# Patient Record
Sex: Female | Born: 1975 | Race: Black or African American | Hispanic: No | Marital: Single | Smoking: Never smoker
Health system: Southern US, Community
[De-identification: ages and names within clinical notes are randomized; demographics above are authoritative.]

## PROBLEM LIST (undated history)

## (undated) DIAGNOSIS — G479 Sleep disorder, unspecified: Secondary | ICD-10-CM

## (undated) DIAGNOSIS — O1490 Unspecified pre-eclampsia, unspecified trimester: Secondary | ICD-10-CM

## (undated) DIAGNOSIS — K219 Gastro-esophageal reflux disease without esophagitis: Secondary | ICD-10-CM

## (undated) DIAGNOSIS — Z8719 Personal history of other diseases of the digestive system: Secondary | ICD-10-CM

## (undated) HISTORY — PX: CHOLECYSTECTOMY: SHX55

## (undated) HISTORY — PX: OTHER SURGICAL HISTORY: SHX169

---

## 2004-01-08 ENCOUNTER — Inpatient Hospital Stay: Payer: Self-pay | Admitting: Obstetrics and Gynecology

## 2005-02-26 IMAGING — CR DG CHEST 2V
1 series · 2 of 2 positions shown · non-contrast
Comparison: none

REASON FOR EXAM: rm sp 2     Flu symptoms
COMMENTS:

PROCEDURE:     DXR - DXR CHEST PA (OR AP) AND LATERAL  - [DATE]  [DATE]
RESULT:        The lungs are clear.  Cardiovascular structures are
unremarkable.

[Series 1: view not recorded · 0.17mm/px · 2 of 2 slices shown]
[im 1/2]
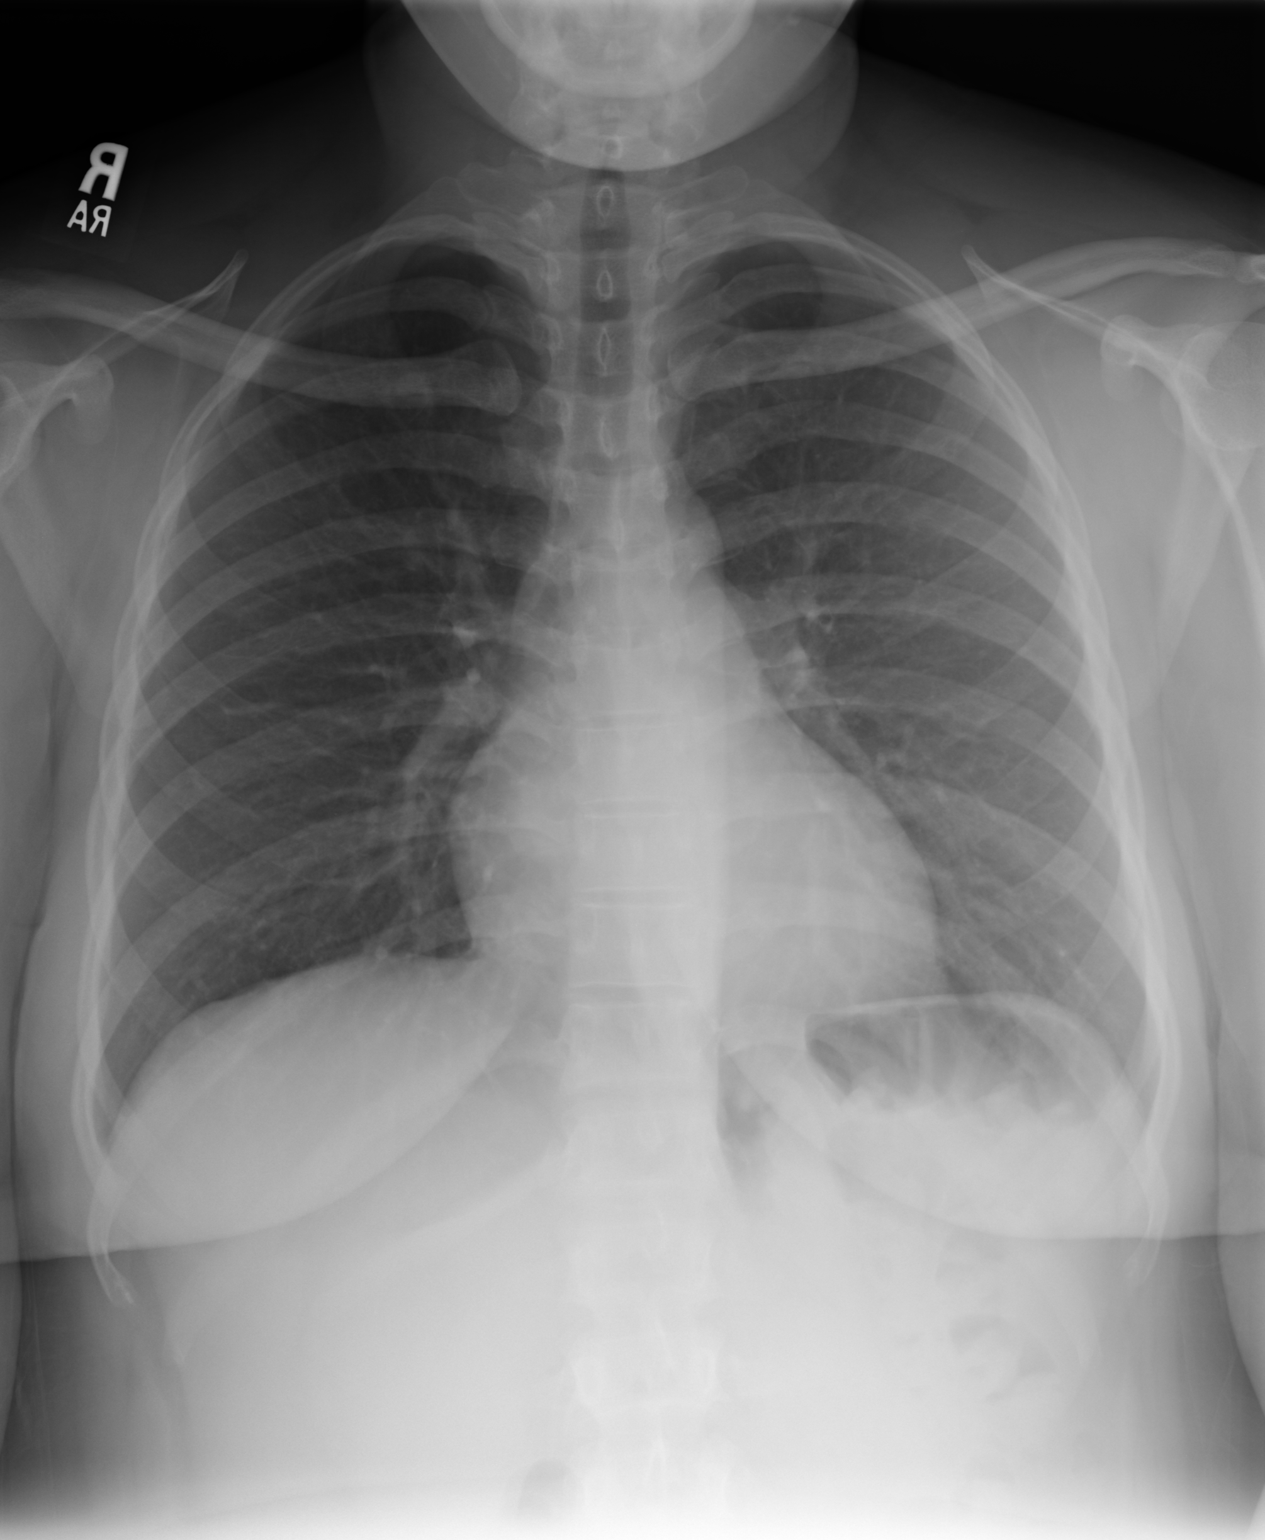
[im 2/2]
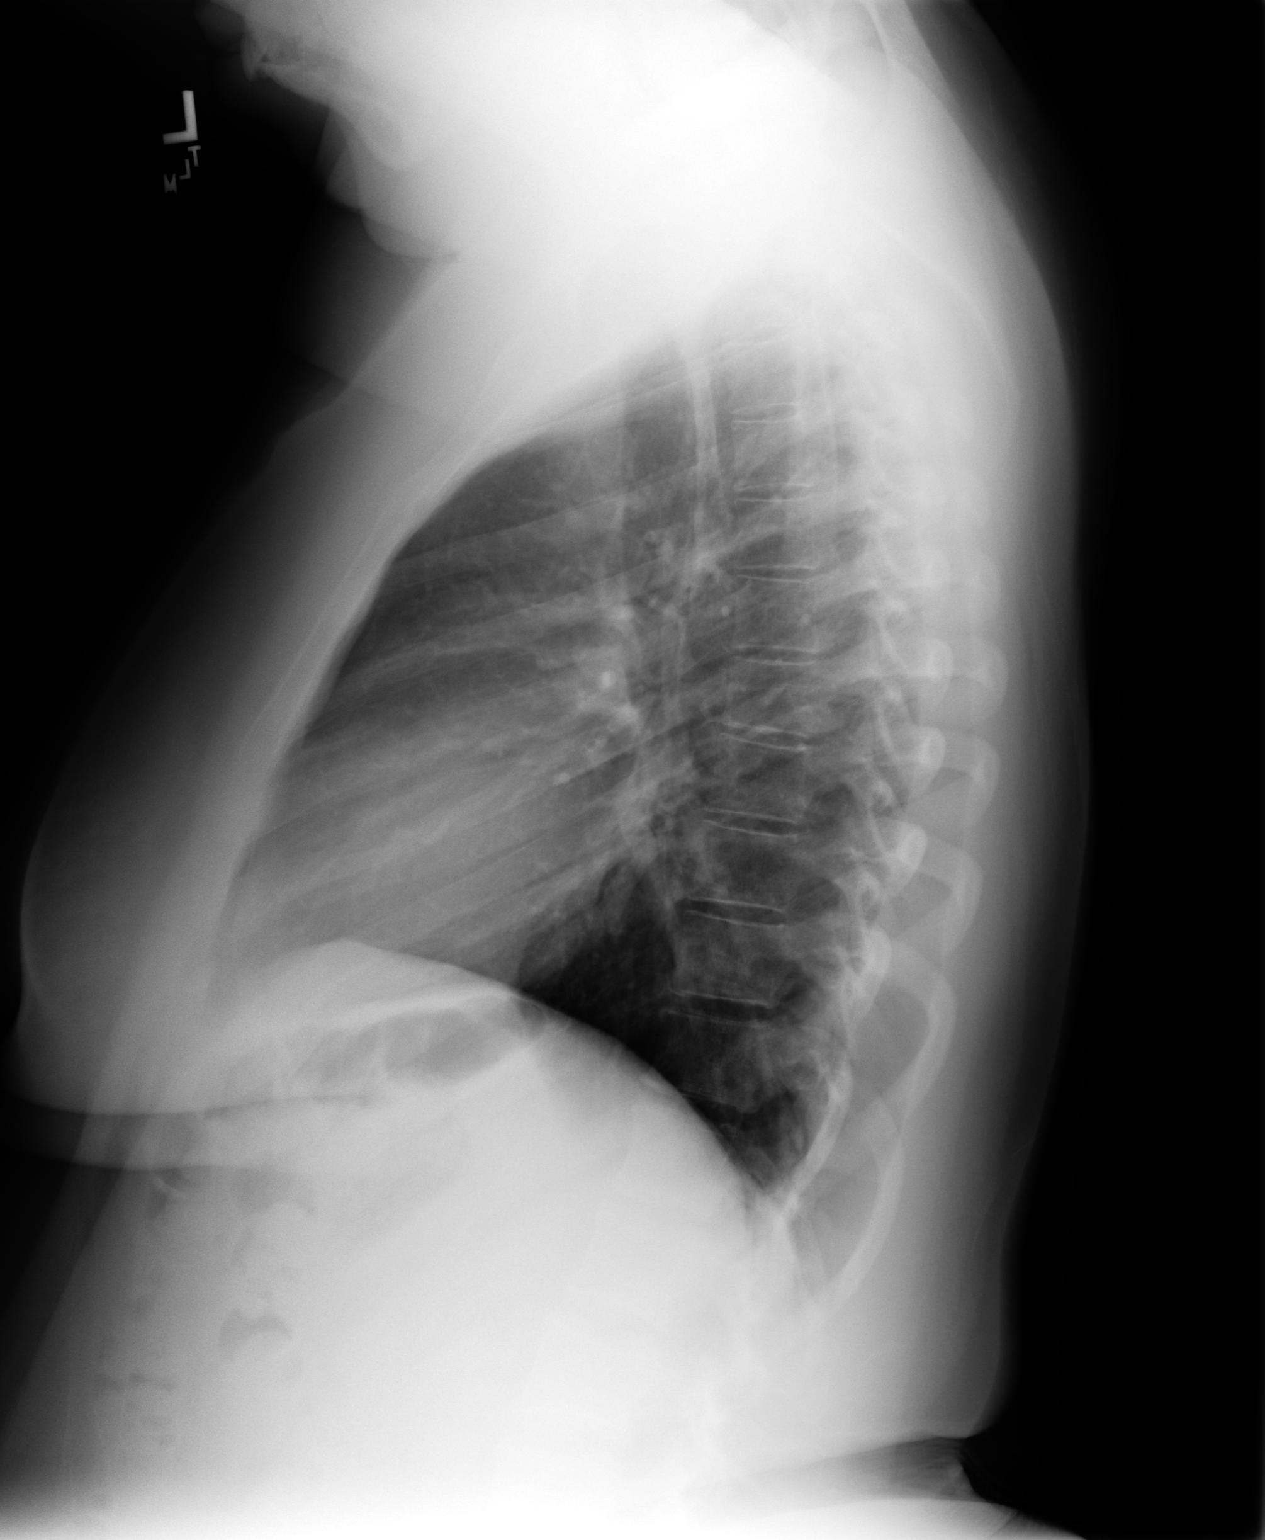

[2 of 2 positions shown; findings below may reference images not displayed]

IMPRESSION: No acute cardiopulmonary disease.

## 2005-05-18 ENCOUNTER — Emergency Department: Payer: Self-pay | Admitting: Emergency Medicine

## 2010-10-18 IMAGING — US ABDOMEN ULTRASOUND
1 series · 17 of 25 positions shown · non-contrast
Comparison: none

REASON FOR EXAM: epigastric pain, Elevated LFTs
COMMENTS:

[Series 1: abdomen ultrasound · 17 of 57 slices shown]
[im 1/57]
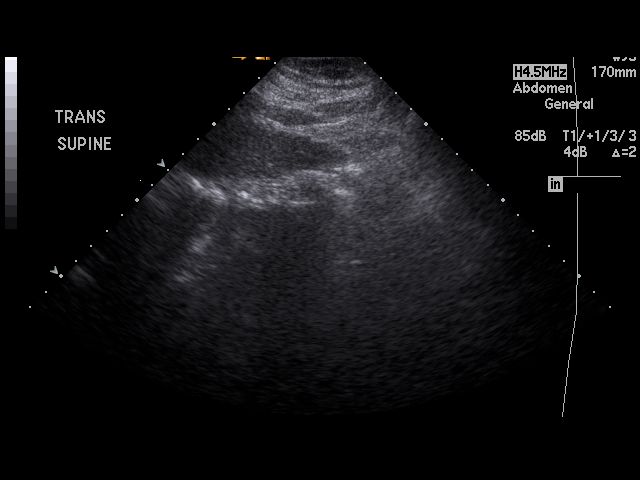
[im 5/57]
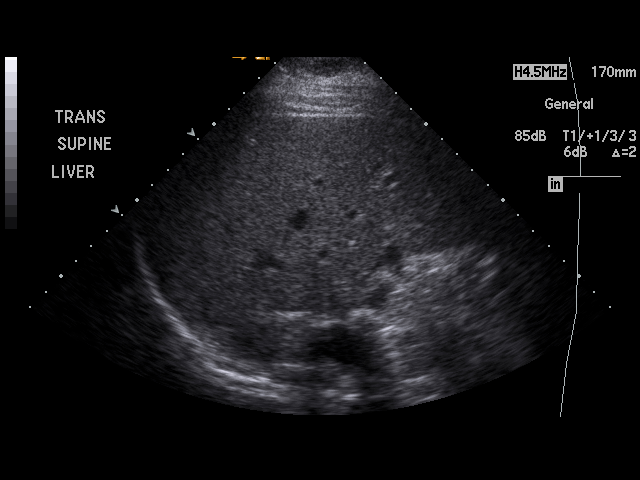
[im 8/57]
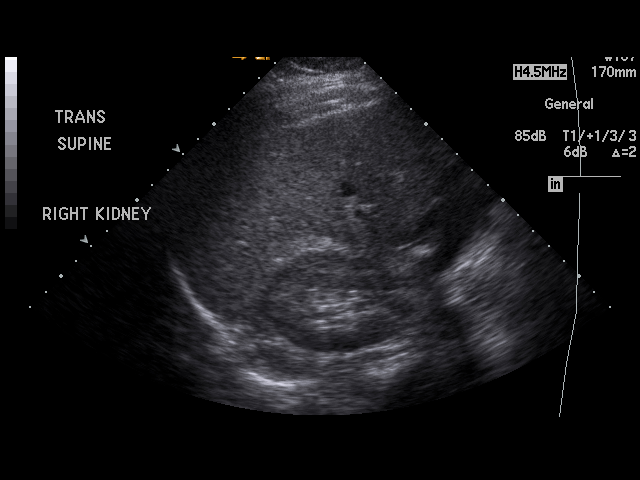
[im 12/57]
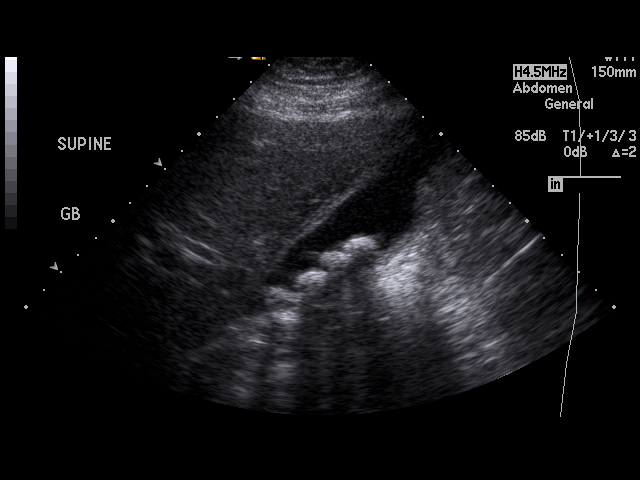
[im 15/57]
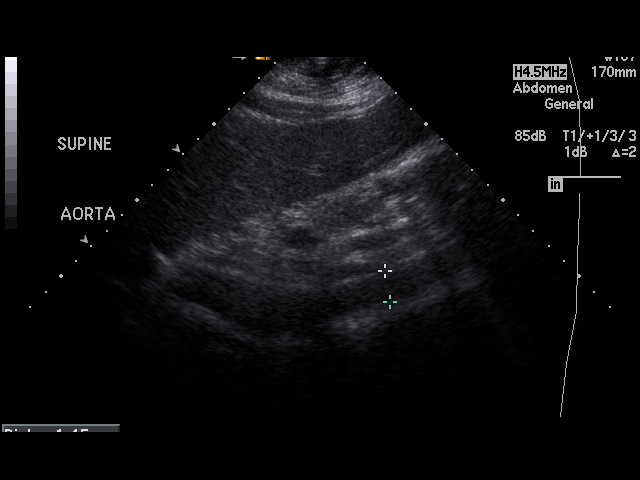
[im 19/57]
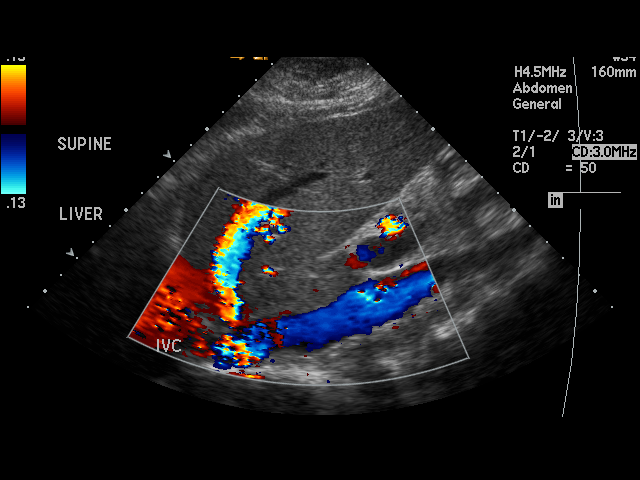
[im 22/57]
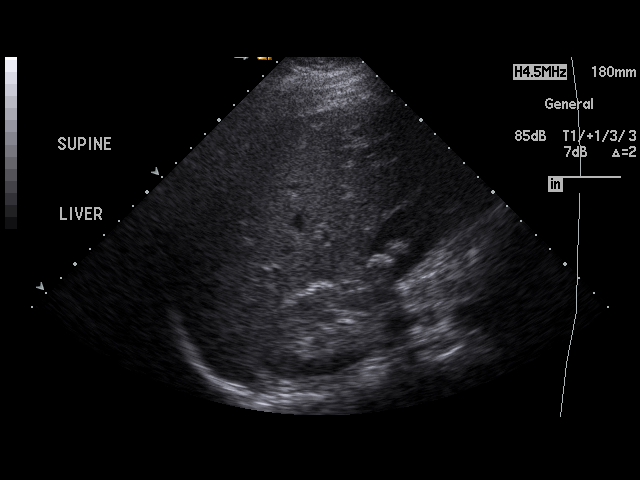
[im 26/57]
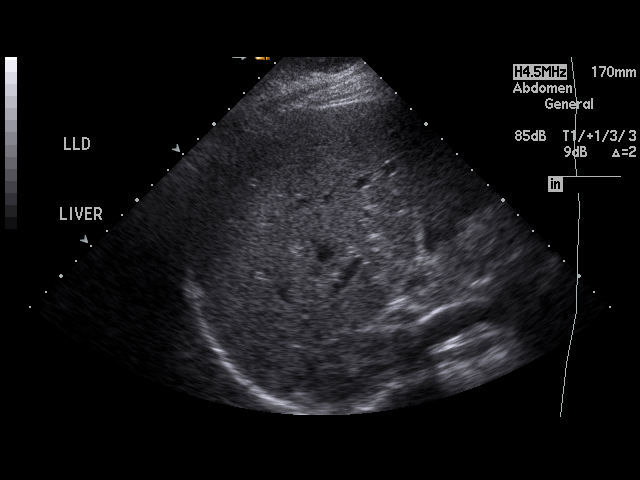
[im 29/57]
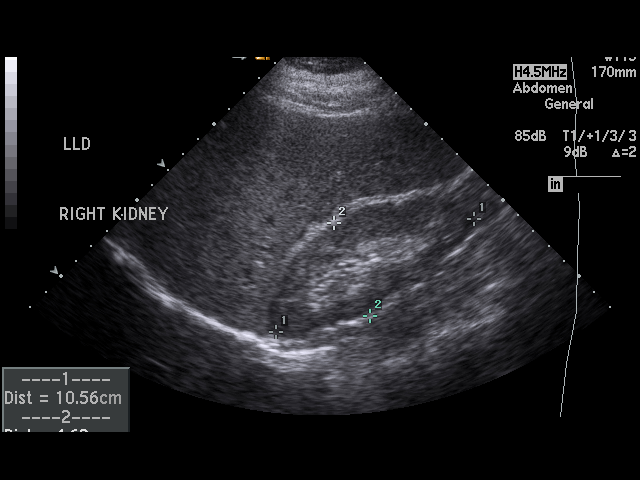
[im 31/57]
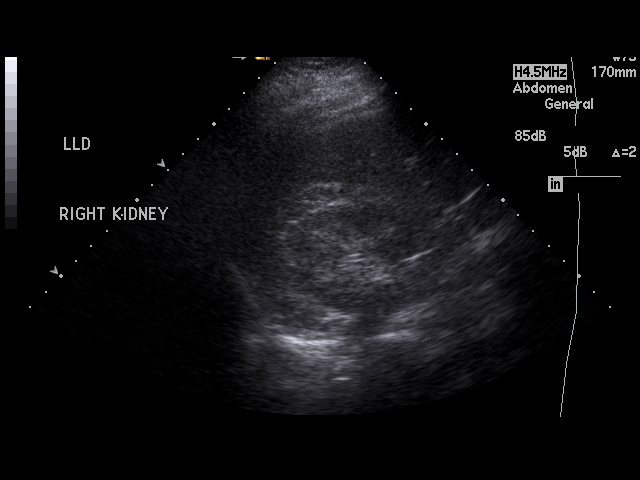
[im 36/57]
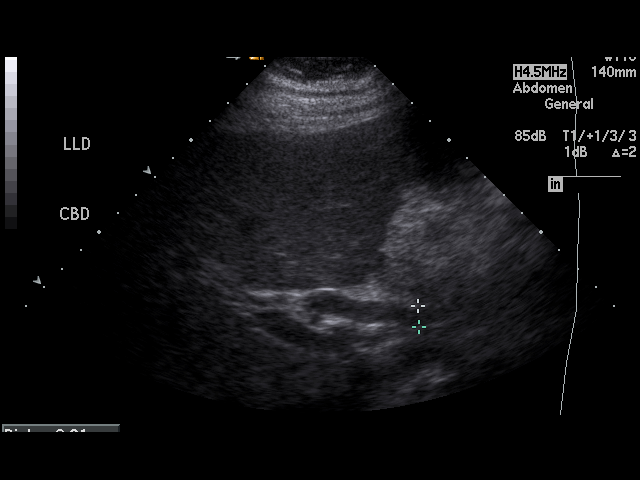
[im 38/57]
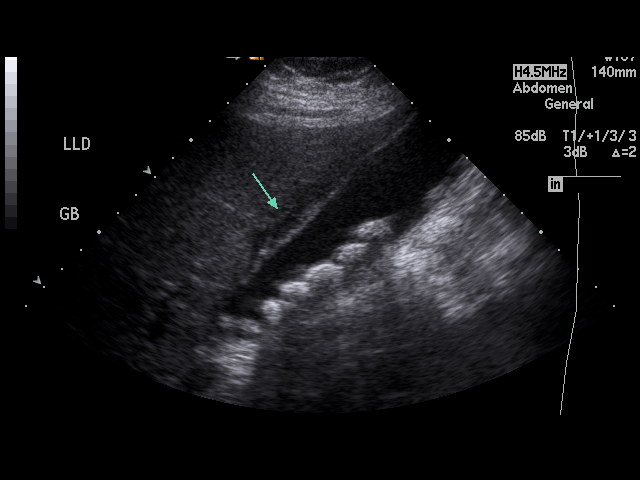
[im 43/57]
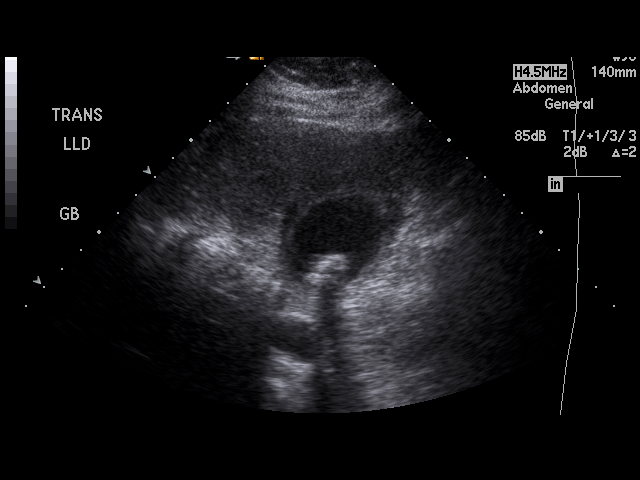
[im 45/57]
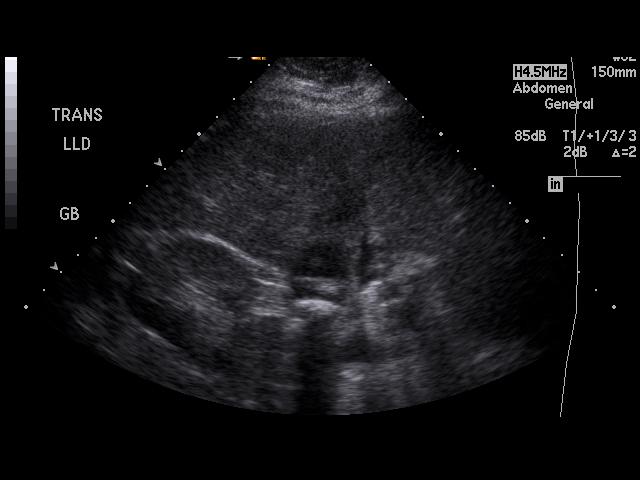
[im 50/57]
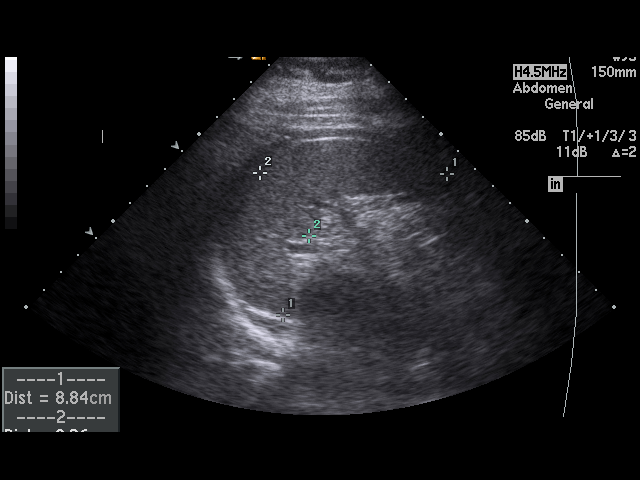
[im 52/57]
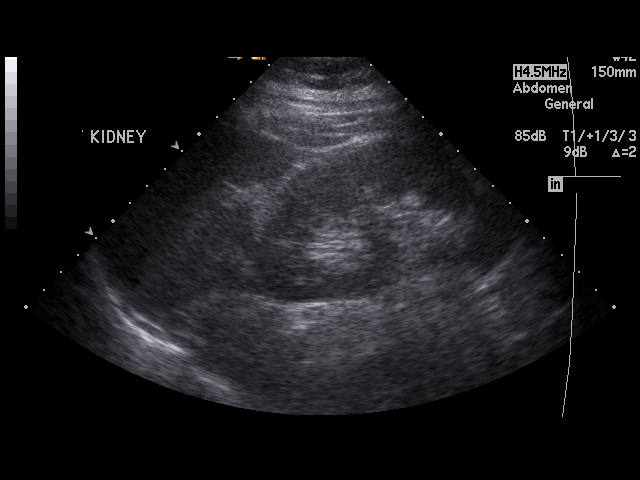
[im 57/57]
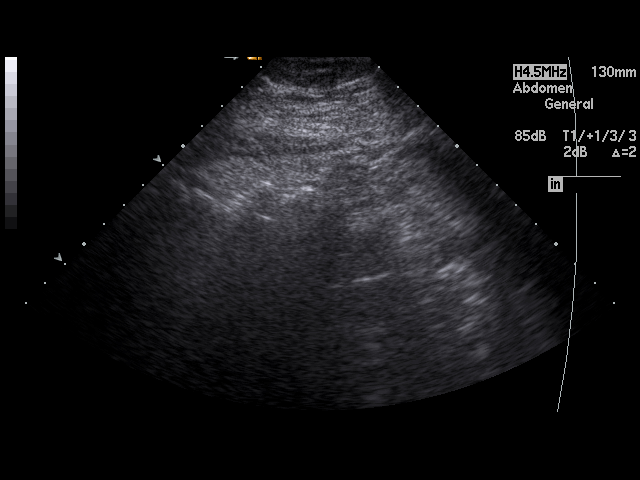

[17 of 25 positions shown; findings below may reference images not displayed]

PROCEDURE:     US  - US ABDOMEN GENERAL SURVEY  - [DATE] [DATE]

RESULT:     The liver exhibits normal echotexture with no focal mass nor
ductal dilation. Portal venous flow is normal in direction toward the liver.
The pancreas could not be demonstrated due to the presence of bowel gas. The
gallbladder is adequately distended and contains multiple echogenic mobile
shadowing stones. There is a positive sonographic Murphy's sign. There is
mild gallbladder wall thickening with a measurement of 4 mm. There is a
small amount of pericholecystic fluid. The common bile duct is mildly
dilated at 8.1 mm.

The spleen is normal in echotexture and measures 8.8 centimeters in length.
The abdominal aorta, inferior vena cava, and kidneys exhibit no acute
abnormality. There is no evidence of ascites.
IMPRESSION: 1. There are multiple gallstones present. There are also findings consistent
with acute cholecystitis including a positive sonographic Murphy's sign,
gallbladder wall thickening, and pericholecystic fluid.
2. There is mild dilation of the common bile duct to 8.1 mm.
3. The pancreas could not be visualized due to the presence of bowel gas.

## 2010-10-19 ENCOUNTER — Inpatient Hospital Stay: Payer: Self-pay | Admitting: Surgery

## 2010-10-19 IMAGING — CR DG CHOLANGIOGRAM OPERATIVE
2 series · 2 of 2 positions shown · non-contrast
Comparison: none

REASON FOR EXAM: LAP CHOLE
COMMENTS:

[1]
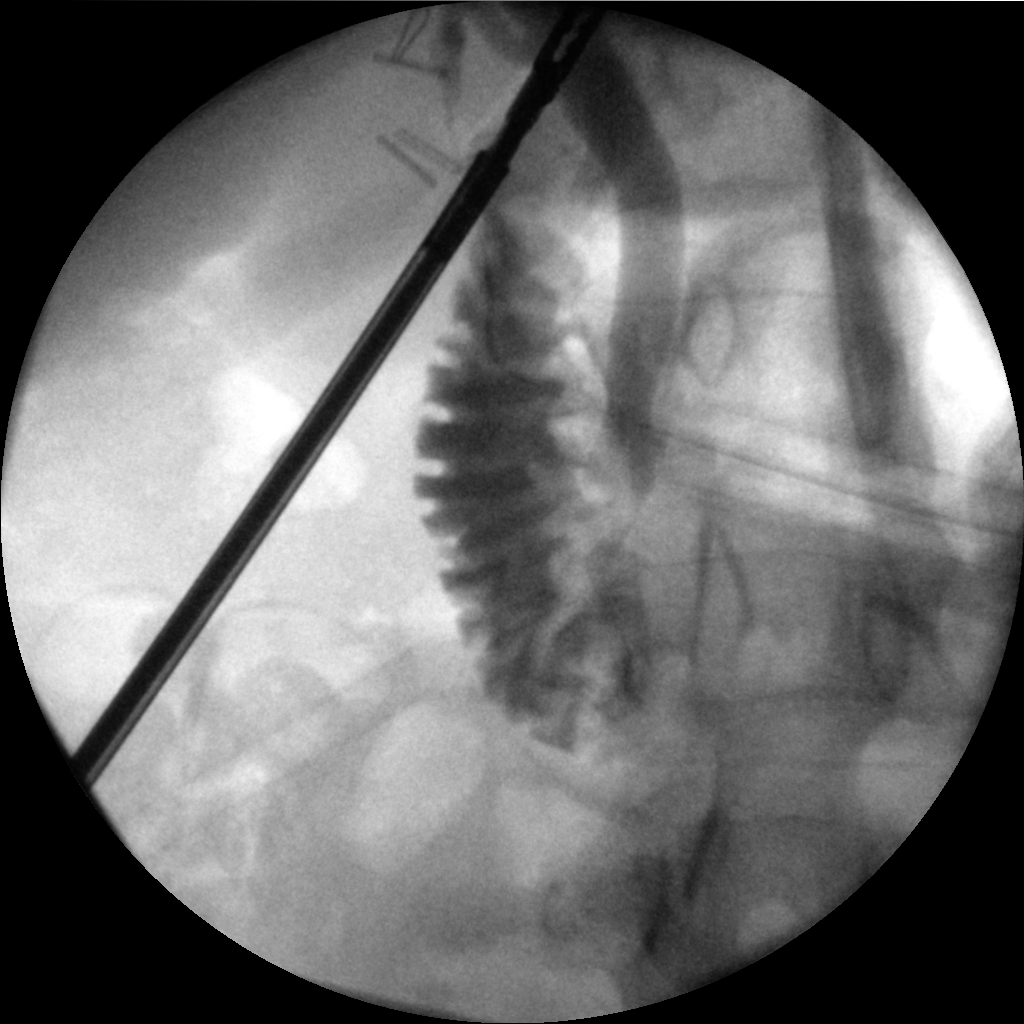

[4]
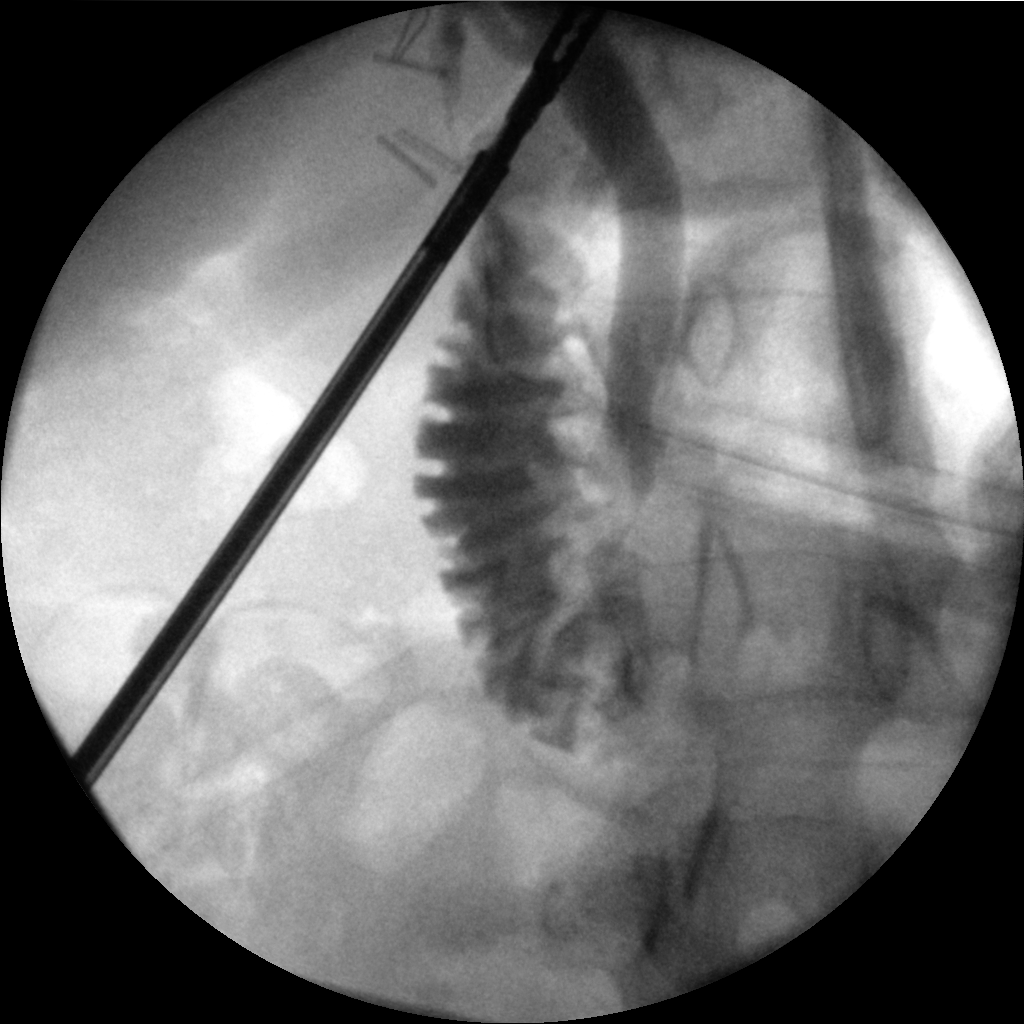

[2 of 2 positions shown; findings below may reference images not displayed]

PROCEDURE:     DXR - DXR CHOLANGIOGRAM OP (INITIAL)  - [DATE]  [DATE]

RESULT:     Contrast material is visualized in the common duct and a portion
of the common hepatic duct. Contrast is noted to flow into the duodenum
without evidence of obstruction. There is tapering of the distal common duct
consistent with spasm.
IMPRESSION: 1.     No retained stone or other acute change is identified.

## 2014-12-26 ENCOUNTER — Other Ambulatory Visit: Payer: Self-pay | Admitting: Nurse Practitioner

## 2014-12-26 DIAGNOSIS — R1032 Left lower quadrant pain: Secondary | ICD-10-CM

## 2014-12-26 DIAGNOSIS — K625 Hemorrhage of anus and rectum: Secondary | ICD-10-CM

## 2014-12-26 DIAGNOSIS — R1031 Right lower quadrant pain: Secondary | ICD-10-CM

## 2014-12-26 DIAGNOSIS — R634 Abnormal weight loss: Secondary | ICD-10-CM

## 2015-01-01 ENCOUNTER — Ambulatory Visit
Admission: RE | Admit: 2015-01-01 | Discharge: 2015-01-01 | Disposition: A | Discharge status: Home / Self Care | Payer: Medicaid Other | Source: Ambulatory Visit | Attending: Nurse Practitioner | Admitting: Nurse Practitioner

## 2015-01-01 ENCOUNTER — Ambulatory Visit: Admission: RE | Admit: 2015-01-01 | Payer: Medicaid Other | Source: Ambulatory Visit | Admitting: Gastroenterology

## 2015-01-01 ENCOUNTER — Encounter: Admission: RE | Payer: Self-pay | Source: Ambulatory Visit

## 2015-01-01 DIAGNOSIS — K625 Hemorrhage of anus and rectum: Secondary | ICD-10-CM | POA: Diagnosis not present

## 2015-01-01 DIAGNOSIS — R1030 Lower abdominal pain, unspecified: Secondary | ICD-10-CM | POA: Diagnosis present

## 2015-01-01 DIAGNOSIS — K449 Diaphragmatic hernia without obstruction or gangrene: Secondary | ICD-10-CM | POA: Diagnosis not present

## 2015-01-01 DIAGNOSIS — R634 Abnormal weight loss: Secondary | ICD-10-CM | POA: Insufficient documentation

## 2015-01-01 DIAGNOSIS — R1031 Right lower quadrant pain: Secondary | ICD-10-CM

## 2015-01-01 DIAGNOSIS — R1032 Left lower quadrant pain: Secondary | ICD-10-CM

## 2015-01-01 IMAGING — CT CT ABD-PELV W/ CM
2 of 4 series · 17 of 46 positions shown, 19 images · IV contrast (omnipaque)
Comparison: Abdomen ultrasound dated [DATE].

CLINICAL DATA: Rectal bleeding and lower abdominal pain for the
past 10 years. Worsening. Previous cholecystectomy.

EXAM:
CT ABDOMEN AND PELVIS WITH CONTRAST
TECHNIQUE: Multidetector CT imaging of the abdomen and pelvis was performed
using the standard protocol following bolus administration of
intravenous contrast.
CONTRAST:  100mL OMNIPAQUE IOHEXOL 300 MG/ML  SOLN

[Series 2: routine abd pel with · axial · 0.96mm/px · z∈[-498,-58]mm · 14 of 96 slices shown, 16 images]
[im 4/96  soft-tissue]
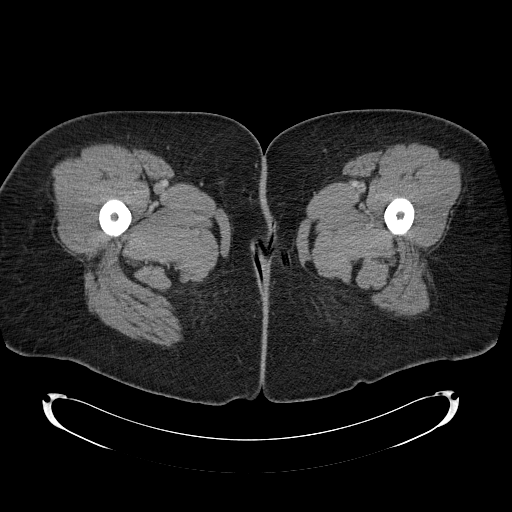
[im 4/96  bone]
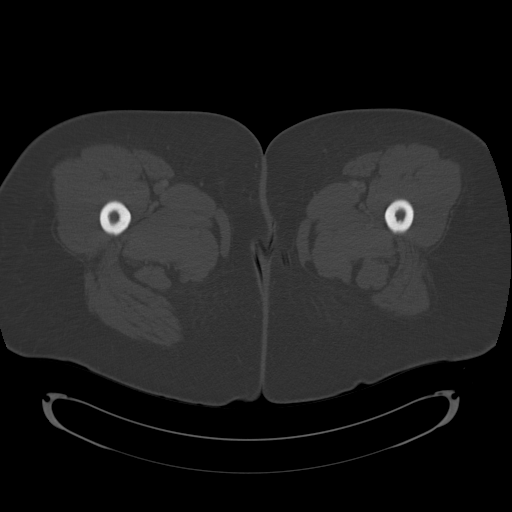
[im 12/96  soft-tissue]
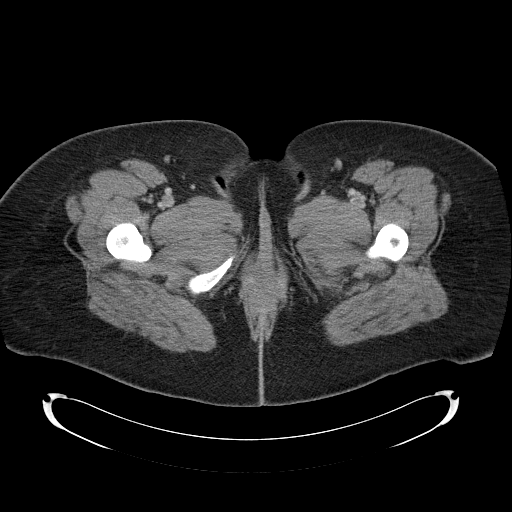
[im 20/96  soft-tissue]
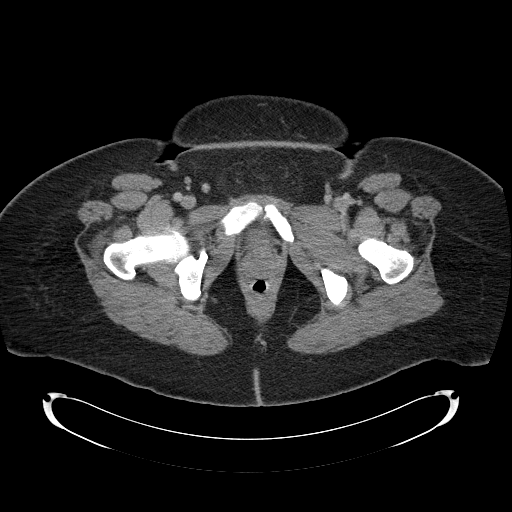
[im 27/96  soft-tissue]
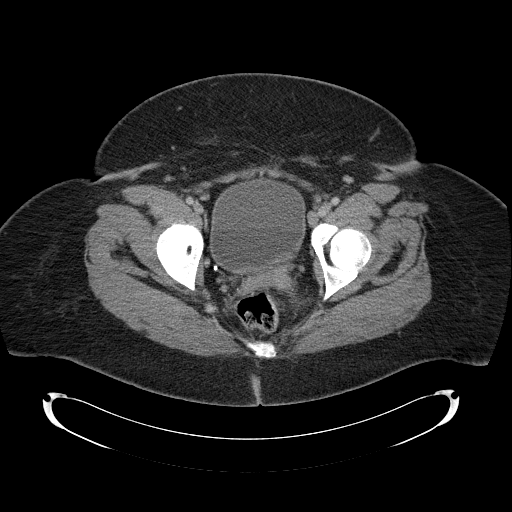
[im 31/96  soft-tissue]
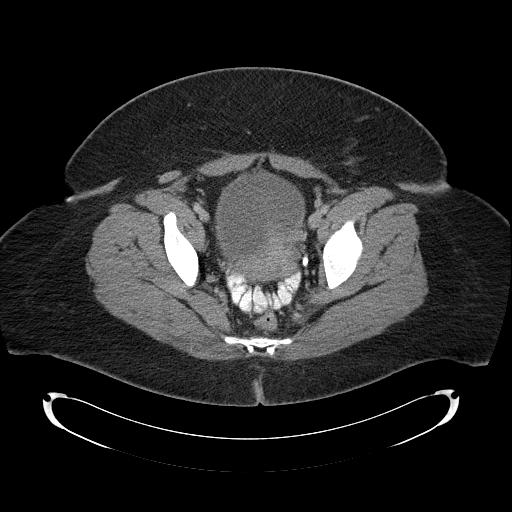
[im 39/96  soft-tissue]
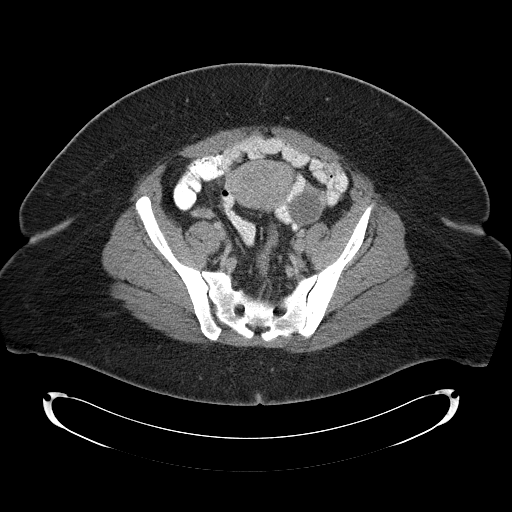
[im 46/96  soft-tissue]
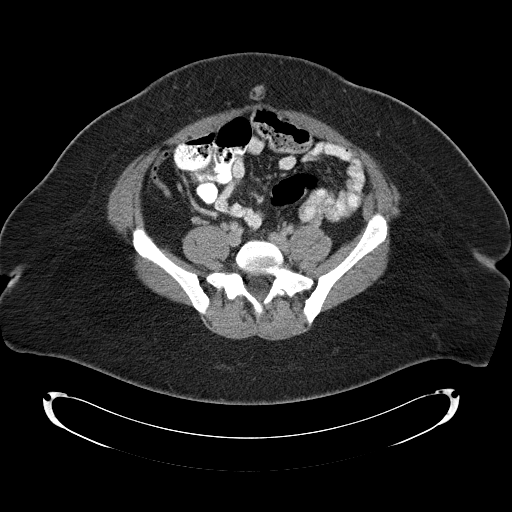
[im 50/96  soft-tissue]
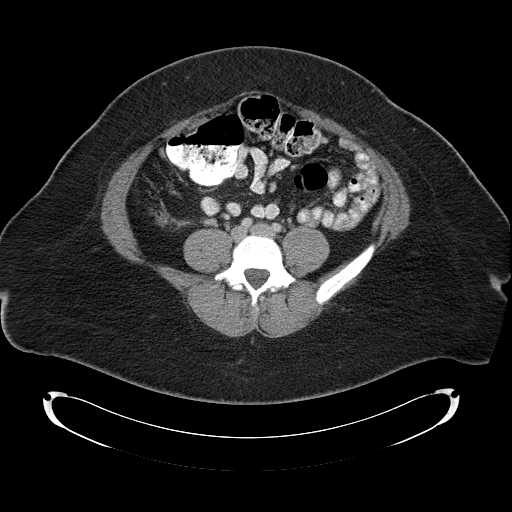
[im 58/96  soft-tissue]
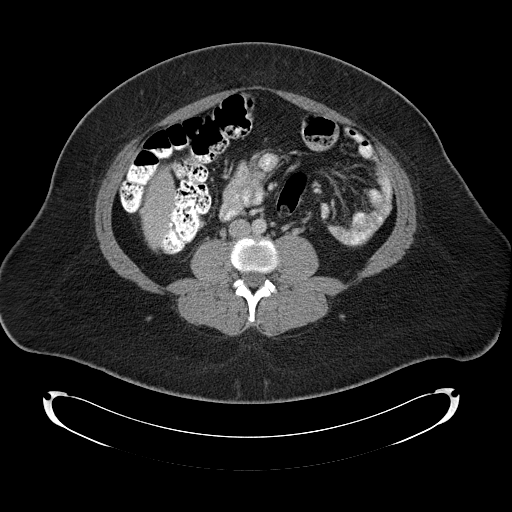
[im 58/96  bone]
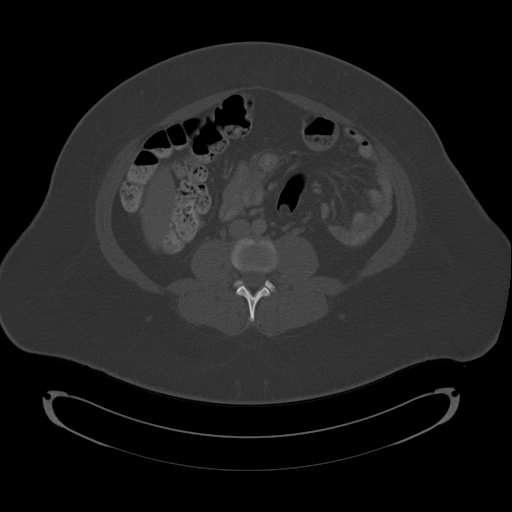
[im 65/96  soft-tissue]
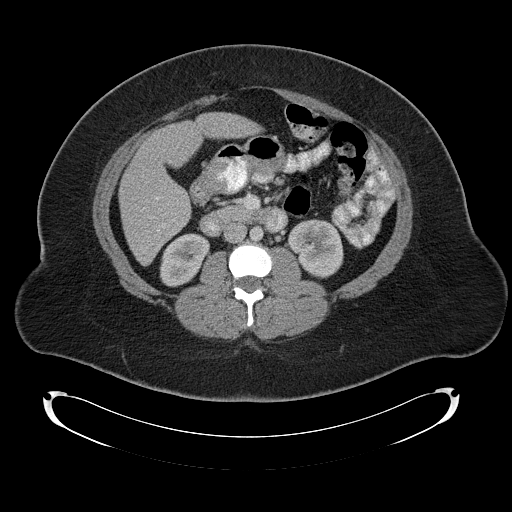
[im 73/96  soft-tissue]
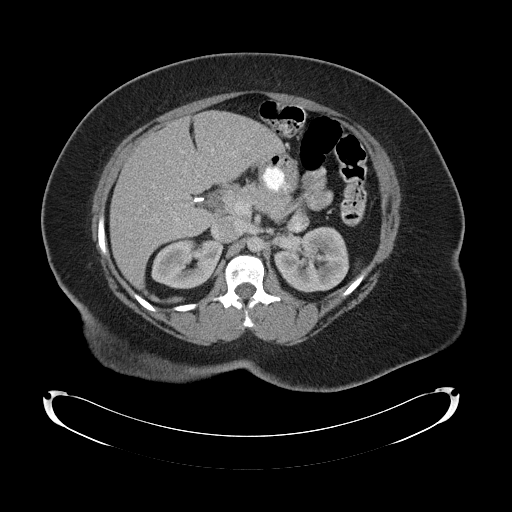
[im 77/96  soft-tissue]
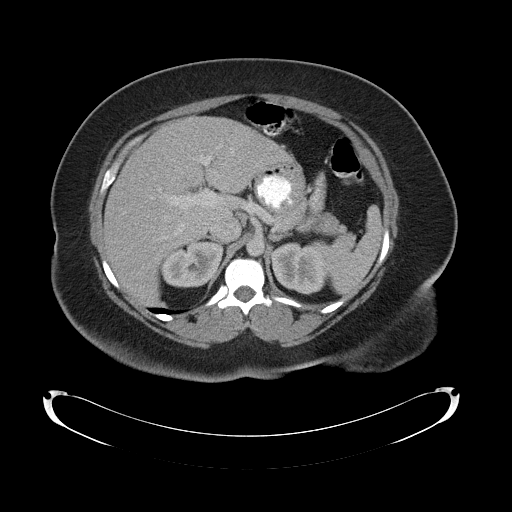
[im 84/96  soft-tissue]
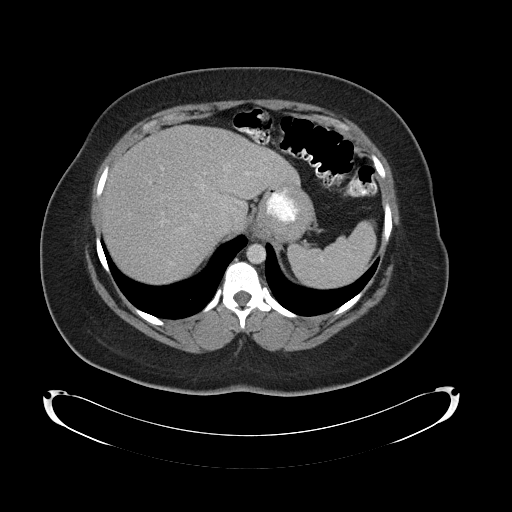
[im 92/96  soft-tissue]
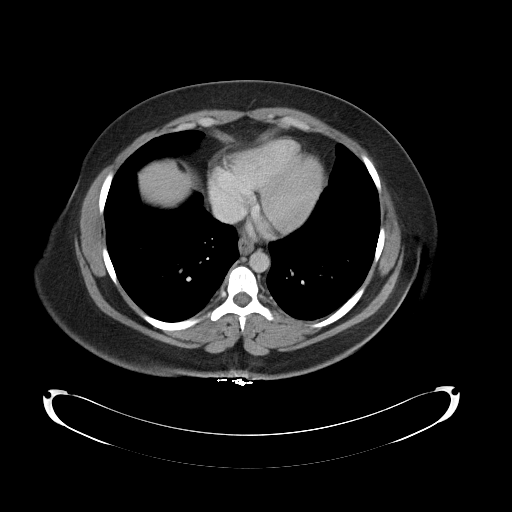

[Series 5: cor routine abd pel with · coronal · 0.87mm/px · 3 of 140 slices shown]
[im 47/140  soft-tissue]
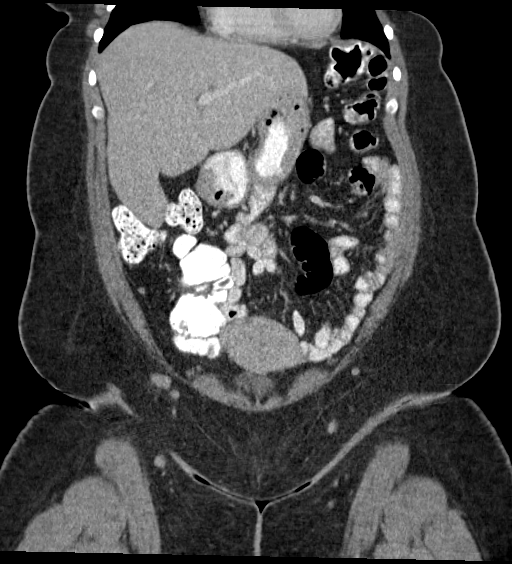
[im 62/140  soft-tissue]
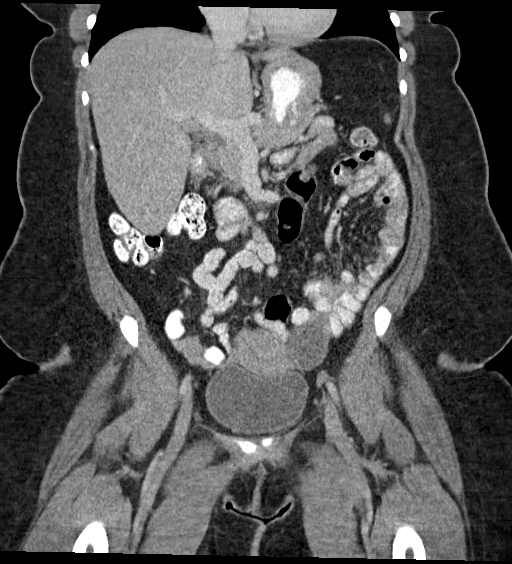
[im 78/140  soft-tissue]
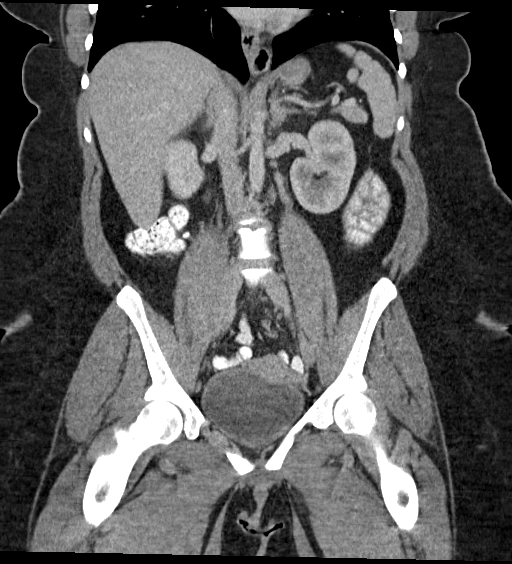

[17 of 46 positions shown; findings below may reference images not displayed]

FINDINGS: Cholecystectomy clips. 3.0 cm left ovarian follicle. Normal
appearing liver, spleen, pancreas, adrenal glands, kidneys, ureters,
urinary bladder, uterus and right ovary. Small sliding hiatal
hernia. No intestinal abnormalities or enlarged lymph nodes. Normal
appearing appendix. Clear lung bases. Mild lower thoracic spine
degenerative changes.
IMPRESSION: Small sliding hiatal hernia.  Otherwise, unremarkable examination.

## 2015-01-01 SURGERY — COLONOSCOPY WITH PROPOFOL
Anesthesia: General

## 2015-01-01 MED ORDER — IOHEXOL 300 MG/ML  SOLN
100.0000 mL | Freq: Once | INTRAMUSCULAR | Status: AC | PRN
  Administered 2015-01-01: 100 mL via INTRAVENOUS

## 2015-02-18 ENCOUNTER — Ambulatory Visit: Payer: Medicaid Other | Attending: Specialist

## 2015-02-18 DIAGNOSIS — G4733 Obstructive sleep apnea (adult) (pediatric): Secondary | ICD-10-CM | POA: Insufficient documentation

## 2015-02-18 DIAGNOSIS — R0683 Snoring: Secondary | ICD-10-CM | POA: Diagnosis present

## 2015-02-18 DIAGNOSIS — E669 Obesity, unspecified: Secondary | ICD-10-CM | POA: Insufficient documentation

## 2015-03-12 ENCOUNTER — Encounter: Payer: Self-pay | Admitting: *Deleted

## 2015-03-13 ENCOUNTER — Ambulatory Visit: Payer: Medicaid Other | Admitting: Registered Nurse

## 2015-03-13 ENCOUNTER — Encounter
Admission: RE | Disposition: A | Discharge status: Home / Self Care | Payer: Self-pay | Source: Ambulatory Visit | Attending: Gastroenterology

## 2015-03-13 ENCOUNTER — Ambulatory Visit
Admission: RE | Admit: 2015-03-13 | Discharge: 2015-03-13 | Disposition: A | Discharge status: Home / Self Care | Payer: Medicaid Other | Source: Ambulatory Visit | Attending: Gastroenterology | Admitting: Gastroenterology

## 2015-03-13 ENCOUNTER — Encounter: Payer: Self-pay | Admitting: *Deleted

## 2015-03-13 DIAGNOSIS — K2950 Unspecified chronic gastritis without bleeding: Secondary | ICD-10-CM | POA: Insufficient documentation

## 2015-03-13 DIAGNOSIS — B9681 Helicobacter pylori [H. pylori] as the cause of diseases classified elsewhere: Secondary | ICD-10-CM | POA: Insufficient documentation

## 2015-03-13 DIAGNOSIS — K625 Hemorrhage of anus and rectum: Secondary | ICD-10-CM | POA: Diagnosis not present

## 2015-03-13 DIAGNOSIS — R634 Abnormal weight loss: Secondary | ICD-10-CM | POA: Diagnosis not present

## 2015-03-13 DIAGNOSIS — K449 Diaphragmatic hernia without obstruction or gangrene: Secondary | ICD-10-CM | POA: Diagnosis not present

## 2015-03-13 DIAGNOSIS — Z6841 Body Mass Index (BMI) 40.0 and over, adult: Secondary | ICD-10-CM | POA: Diagnosis not present

## 2015-03-13 DIAGNOSIS — K219 Gastro-esophageal reflux disease without esophagitis: Secondary | ICD-10-CM | POA: Diagnosis not present

## 2015-03-13 DIAGNOSIS — I10 Essential (primary) hypertension: Secondary | ICD-10-CM | POA: Diagnosis not present

## 2015-03-13 HISTORY — DX: Gastro-esophageal reflux disease without esophagitis: K21.9

## 2015-03-13 HISTORY — PX: COLONOSCOPY WITH PROPOFOL: SHX5780

## 2015-03-13 HISTORY — DX: Sleep disorder, unspecified: G47.9

## 2015-03-13 HISTORY — DX: Unspecified pre-eclampsia, unspecified trimester: O14.90

## 2015-03-13 HISTORY — DX: Personal history of other diseases of the digestive system: Z87.19

## 2015-03-13 HISTORY — PX: ESOPHAGOGASTRODUODENOSCOPY (EGD) WITH PROPOFOL: SHX5813

## 2015-03-13 LAB — POCT PREGNANCY, URINE: Preg Test, Ur: NEGATIVE

## 2015-03-13 SURGERY — ESOPHAGOGASTRODUODENOSCOPY (EGD) WITH PROPOFOL
Anesthesia: General

## 2015-03-13 MED ORDER — PROPOFOL 500 MG/50ML IV EMUL
INTRAVENOUS | Status: DC | PRN
  Administered 2015-03-13: 170 ug/kg/min via INTRAVENOUS

## 2015-03-13 MED ORDER — PROPOFOL 10 MG/ML IV BOLUS
INTRAVENOUS | Status: DC | PRN
  Administered 2015-03-13: 50 mg via INTRAVENOUS

## 2015-03-13 MED ORDER — GLYCOPYRROLATE 0.2 MG/ML IJ SOLN
INTRAMUSCULAR | Status: DC | PRN
  Administered 2015-03-13: 0.2 mg via INTRAVENOUS

## 2015-03-13 MED ORDER — SODIUM CHLORIDE 0.9 % IV SOLN
INTRAVENOUS | Status: DC
  Administered 2015-03-13: 11:00:00 via INTRAVENOUS

## 2015-03-13 MED ORDER — SODIUM CHLORIDE 0.9 % IV SOLN
INTRAVENOUS | Status: DC
  Administered 2015-03-13: 1000 mL via INTRAVENOUS

## 2015-03-13 MED ORDER — FENTANYL CITRATE (PF) 100 MCG/2ML IJ SOLN
INTRAMUSCULAR | Status: DC | PRN
  Administered 2015-03-13: 50 ug via INTRAVENOUS

## 2015-03-13 MED ORDER — MIDAZOLAM HCL 2 MG/2ML IJ SOLN
INTRAMUSCULAR | Status: DC | PRN
  Administered 2015-03-13: 1 mg via INTRAVENOUS

## 2015-03-13 MED ORDER — LIDOCAINE HCL (CARDIAC) 20 MG/ML IV SOLN
INTRAVENOUS | Status: DC | PRN
  Administered 2015-03-13: 40 mg via INTRAVENOUS

## 2015-03-13 NOTE — Anesthesia Postprocedure Evaluation (Signed)
Anesthesia Post Note  Patient: Leah Dodson  Procedure(s) Performed: Procedure(s) (LRB): ESOPHAGOGASTRODUODENOSCOPY (EGD) WITH PROPOFOL (N/A) COLONOSCOPY WITH PROPOFOL (N/A)  Patient location during evaluation: PACU Anesthesia Type: General Level of consciousness: awake Pain management: satisfactory to patient Vital Signs Assessment: post-procedure vital signs reviewed and stable Respiratory status: spontaneous breathing Cardiovascular status: stable Anesthetic complications: no    Last Vitals:  Filed Vitals:   03/13/15 1105 03/13/15 1108  BP: 80/49 99/51  Pulse: 70 93  Temp: 36.2 C   Resp: 18 35    Last Pain: There were no vitals filed for this visit.               VAN STAVEREN,[name]

## 2015-03-13 NOTE — Op Note (Signed)
[facility] Gastroenterology Patient Name: Leah Dodson Procedure Date: 03/13/2015 10:39 AM MRN: [id number] Account #: [id number] Date of Birth: 1976-02-03 Admit Type: Outpatient Age: 40 Room: [facility] ENDO ROOM 4 Gender: Female Note Status: Finalized Procedure:         Colonoscopy Indications:       Rectal bleeding, Weight loss Providers:         Ezzard Standing. Bluford Kaufmann, MD Medicines:         Monitored Anesthesia Care Complications:     No immediate complications. Procedure:         Pre-Anesthesia Assessment:                    - Prior to the procedure, a History and Physical was                     performed, and patient medications, allergies and                     sensitivities were reviewed. The patient's tolerance of                     previous anesthesia was reviewed.                    - The risks and benefits of the procedure and the sedation                     options and risks were discussed with the patient. All                     questions were answered and informed consent was obtained.                    - After reviewing the risks and benefits, the patient was                     deemed in satisfactory condition to undergo the procedure.                    After obtaining informed consent, the colonoscope was                     passed under direct vision. Throughout the procedure, the                     patient's blood pressure, pulse, and oxygen saturations                     were monitored continuously. The Colonoscope was                     introduced through the anus and advanced to the the cecum,                     identified by appendiceal orifice and ileocecal valve. The                     colonoscopy was performed without difficulty. The patient                     tolerated the procedure well. The quality of the bowel                     preparation was good. Findings:  The colon (entire examined portion) appeared normal. Impression:         - The entire examined colon is normal.                    - No specimens collected. Recommendation:    - Discharge patient to home.                    - Repeat colonoscopy in 10 years for screening purposes.                    - The findings and recommendations were discussed with the                     patient. Procedure Code(s): --- Professional ---                    [phone], Colonoscopy, flexible; diagnostic, including                     collection of specimen(s) by brushing or washing, when                     performed (separate procedure) Diagnosis Code(s): --- Professional ---                    K62.5, Hemorrhage of anus and rectum                    R63.4, Abnormal weight loss CPT copyright 2014 American Medical Association. All rights reserved. The codes documented in this report are preliminary and upon coder review may  be revised to meet current compliance requirements. Wallace CullensPaul Y Quashaun Lazalde, MD 03/13/2015 11:03:47 AM This report has been signed electronically. Number of Addenda: 0 Note Initiated On: 03/13/2015 10:39 AM Scope Withdrawal Time: 0 hours 6 minutes 16 seconds  Total Procedure Duration: 0 hours 9 minutes 7 seconds       [facility]

## 2015-03-13 NOTE — Op Note (Signed)
[facility]enter[facility] Patient Name: Leah Dodson Procedure Date: 03/13/2015 10:40 AM MRN: [id number] Account #: [id number] Date of Birth: 11/05/1975 Admit Type: Outpatient Age: [age] [facility] END[facility] ROOM 4 Gender: Female Note Status: Finalized Procedure:         Upper GI endoscopy Indications:       Suspected esophageal reflux, Weight loss Providers:         Ezzard StandingPaul Y. Bluford Kaufmannh, MD Medicines:         Monitored Anesthesia Care Complications:     No immediate complications. Procedure:         Pre-Anesthesia Assessment:                    - Prior to the procedure, a History and Physical was                     performed, and patient medications, allergies and                     sensitivities were reviewed. The patient's tolerance of                     previous anesthesia was reviewed.                    - The risks and benefits of the procedure and the sedation                     options and risks were discussed with the patient. All                     questions were answered and informed consent was obtained.                    - After reviewing the risks and benefits, the patient was                     deemed in satisfactory condition to undergo the procedure.                    After obtaining informed consent, the endoscope was passed                     under direct vision. Throughout the procedure, the                     patient's blood pressure, pulse, and oxygen saturations                     were monitored continuously. The Endoscope was introduced                     through the mouth, and advanced to the second part of                     duodenum. The upper GI endoscopy was accomplished without                     difficulty. The patient tolerated the procedure well. Findings:      The examined esophagus was normal. Biopsies were taken with a cold       forceps for histology.      Localized mildly erythematous mucosa was found in the gastric antrum.        Biopsies were taken  with a cold forceps for histology.      The exam was otherwise without abnormality.      The examined duodenum was normal. Impression:        - Normal esophagus. Biopsied.                    - Erythematous mucosa in the antrum. Biopsied.                    - The examination was otherwise normal.                    - Normal examined duodenum. Recommendation:    - Discharge patient to home.                    - Observe patient's clinical course. Procedure Code(s): --- Professional ---                    [phone], Esophagogastroduodenoscopy, flexible, transoral;                     with biopsy, single or multiple Diagnosis Code(s): --- Professional ---                    K31.89, Other diseases of stomach and duodenum                    R63.4, Abnormal weight loss CPT copyright 2014 American Medical Association. All rights reserved. The codes documented in this report are preliminary and upon coder review may  be revised to meet current compliance requirements. Wallace Cullens, MD 03/13/2015 10:49:43 AM This report has been signed electronically. Number of Addenda: 0 Note Initiated On: 03/13/2015 10:40 AM      [facility]

## 2015-03-13 NOTE — Anesthesia Preprocedure Evaluation (Signed)
Anesthesia Evaluation  Patient identified by MRN, date of birth, ID band Patient awake    Reviewed: Allergy & Precautions, NPO status , Patient's Chart, lab work & pertinent test results  Airway Mallampati: III       Dental  (+) Teeth Intact   Pulmonary neg pulmonary ROS,     + decreased breath sounds      Cardiovascular hypertension, Pt. on medications  Rhythm:Regular Rate:Normal     Neuro/Psych    GI/Hepatic Neg liver ROS, hiatal hernia, GERD  ,  Endo/Other  Morbid obesity  Renal/GU negative Renal ROS     Musculoskeletal negative musculoskeletal ROS (+)   Abdominal (+) + obese,   Peds  Hematology   Anesthesia Other Findings   Reproductive/Obstetrics                             Anesthesia Physical Anesthesia Plan  ASA: III  Anesthesia Plan: General   Post-op Pain Management:    Induction: Intravenous  Airway Management Planned: Nasal Cannula  Additional Equipment:   Intra-op Plan:   Post-operative Plan:   Informed Consent: I have reviewed the patients History and Physical, chart, labs and discussed the procedure including the risks, benefits and alternatives for the proposed anesthesia with the patient or authorized representative who has indicated his/her understanding and acceptance.     Plan Discussed with: CRNA  Anesthesia Plan Comments:         Anesthesia Quick Evaluation

## 2015-03-13 NOTE — Anesthesia Procedure Notes (Signed)
Date/Time: 03/13/2015 10:38 AM Performed by: Stormy FabianURTIS, Eastyn Dattilo Pre-anesthesia Checklist: Patient identified, Emergency Drugs available, Suction available and Patient being monitored Patient Re-evaluated:Patient Re-evaluated prior to inductionOxygen Delivery Method: Nasal cannula Intubation Type: IV induction Dental Injury: Teeth and Oropharynx as per pre-operative assessment  Comments: Nasal cannula with etCO2 monitoring

## 2015-03-13 NOTE — Transfer of Care (Signed)
Immediate Anesthesia Transfer of Care Note  Patient: Salvadore OxfordLatisha S Santillo  Procedure(s) Performed: Procedure(s): ESOPHAGOGASTRODUODENOSCOPY (EGD) WITH PROPOFOL (N/A) COLONOSCOPY WITH PROPOFOL (N/A)  Patient Location: PACU and Endoscopy Unit  Anesthesia Type:General  Level of Consciousness: sedated  Airway & Oxygen Therapy: Patient Spontanous Breathing and Patient connected to nasal cannula oxygen  Post-op Assessment: Report given to RN and Post -op Vital signs reviewed and stable  Post vital signs: Reviewed and stable  Last Vitals:  Filed Vitals:   03/13/15 1005 03/13/15 1105  BP: 154/84 80/49  Pulse: 86 70  Temp: 37.1 C 36.2 C  Resp: 16 18    Complications: No apparent anesthesia complications

## 2015-03-13 NOTE — H&P (Signed)
    Primary Care Physician:  No primary care provider on file. Primary Gastroenterologist:  Dr. Bluford Kaufmannh  Pre-Procedure History & Physical: HPI:  Leah OxfordLatisha S Blixt is a 40 y.o. female is here for an EGD/colonoscopy.   Past Medical History  Diagnosis Date  . Disturbance of sleep   . History of hiatal hernia   . GERD (gastroesophageal reflux disease)   . Preeclampsia     History reviewed. No pertinent past surgical history.  Prior to Admission medications   Medication Sig Start Date End Date Taking? Authorizing Provider  Hyoscyamine Sulfate (HYOSCYAMINE PO) Take 1 tablet by mouth as needed.   Yes Historical Provider, MD  omeprazole (PRILOSEC) 40 MG capsule Take 40 mg by mouth daily.   Yes Historical Provider, MD    Allergies as of 02/26/2015  . (Not on File)    History reviewed. No pertinent family history.  Social History   Social History  . Marital Status: Single    Spouse Name: N/A  . Number of Children: N/A  . Years of Education: N/A   Occupational History  . Not on file.   Social History Main Topics  . Smoking status: Not on file  . Smokeless tobacco: Not on file  . Alcohol Use: Not on file  . Drug Use: Not on file  . Sexual Activity: Not on file   Other Topics Concern  . Not on file   Social History Narrative    Review of Systems: See HPI, otherwise negative ROS  Physical Exam: There were no vitals taken for this visit. General:   Alert,  pleasant and cooperative in NAD Head:  Normocephalic and atraumatic. Neck:  Supple; no masses or thyromegaly. Lungs:  Clear throughout to auscultation.    Heart:  Regular rate and rhythm. Abdomen:  Soft, nontender and nondistended. Normal bowel sounds, without guarding, and without rebound.   Neurologic:  Alert and  oriented x4;  grossly normal neurologically.  Impression/Plan: Leah Dodson is here for an EGD/colonsocopy to be performed for GERD, wt loss, rectal bleeding, and abdominal pain  Risks, benefits,  limitations, and alternatives regarding  EGD/colonoscopy have been reviewed with the patient.  Questions have been answered.  All parties agreeable.   Damontre Millea, Ezzard StandingPAUL Y, MD  03/13/2015, 9:50 AM

## 2015-03-16 LAB — SURGICAL PATHOLOGY

## 2015-03-19 ENCOUNTER — Encounter: Payer: Self-pay | Admitting: Gastroenterology

## 2017-02-23 ENCOUNTER — Ambulatory Visit: Payer: Medicaid Other | Attending: Neurology

## 2017-02-23 DIAGNOSIS — R400 Somnolence: Secondary | ICD-10-CM | POA: Insufficient documentation

## 2017-02-23 DIAGNOSIS — F5101 Primary insomnia: Secondary | ICD-10-CM | POA: Insufficient documentation

## 2017-02-23 DIAGNOSIS — R51 Headache: Secondary | ICD-10-CM | POA: Insufficient documentation

## 2017-02-23 DIAGNOSIS — E669 Obesity, unspecified: Secondary | ICD-10-CM | POA: Insufficient documentation

## 2017-02-23 DIAGNOSIS — R0683 Snoring: Secondary | ICD-10-CM | POA: Insufficient documentation

## 2017-02-23 DIAGNOSIS — G4733 Obstructive sleep apnea (adult) (pediatric): Secondary | ICD-10-CM | POA: Insufficient documentation

## 2017-02-23 DIAGNOSIS — G4730 Sleep apnea, unspecified: Secondary | ICD-10-CM | POA: Diagnosis present

## 2017-03-10 ENCOUNTER — Ambulatory Visit: Payer: Medicaid Other | Attending: Neurology

## 2017-03-16 ENCOUNTER — Ambulatory Visit: Payer: Medicaid Other | Attending: Neurology

## 2017-03-16 DIAGNOSIS — G4733 Obstructive sleep apnea (adult) (pediatric): Secondary | ICD-10-CM | POA: Insufficient documentation

## 2017-03-16 DIAGNOSIS — F5101 Primary insomnia: Secondary | ICD-10-CM | POA: Insufficient documentation

## 2020-08-16 ENCOUNTER — Encounter: Payer: Self-pay | Admitting: Emergency Medicine

## 2020-08-16 ENCOUNTER — Other Ambulatory Visit: Payer: Self-pay

## 2020-08-16 ENCOUNTER — Emergency Department
Admission: EM | Admit: 2020-08-16 | Discharge: 2020-08-16 | Disposition: A | Discharge status: Home / Self Care | Payer: 59 | Attending: Emergency Medicine | Admitting: Emergency Medicine

## 2020-08-16 DIAGNOSIS — G4700 Insomnia, unspecified: Secondary | ICD-10-CM | POA: Diagnosis not present

## 2020-08-16 DIAGNOSIS — M791 Myalgia, unspecified site: Secondary | ICD-10-CM | POA: Insufficient documentation

## 2020-08-16 DIAGNOSIS — R0981 Nasal congestion: Secondary | ICD-10-CM | POA: Diagnosis not present

## 2020-08-16 DIAGNOSIS — R519 Headache, unspecified: Secondary | ICD-10-CM | POA: Insufficient documentation

## 2020-08-16 DIAGNOSIS — Z20822 Contact with and (suspected) exposure to covid-19: Secondary | ICD-10-CM | POA: Insufficient documentation

## 2020-08-16 LAB — CBC WITH DIFFERENTIAL/PLATELET
Abs Immature Granulocytes: 0.02 10*3/uL (ref 0.00–0.07)
Basophils Absolute: 0 10*3/uL (ref 0.0–0.1)
Basophils Relative: 1 %
Eosinophils Absolute: 0.3 10*3/uL (ref 0.0–0.5)
Eosinophils Relative: 4 %
HCT: 32.4 % — ABNORMAL LOW (ref 36.0–46.0)
Hemoglobin: 11.1 g/dL — ABNORMAL LOW (ref 12.0–15.0)
Immature Granulocytes: 0 %
Lymphocytes Relative: 24 %
Lymphs Abs: 2 10*3/uL (ref 0.7–4.0)
MCH: 27.4 pg (ref 26.0–34.0)
MCHC: 34.3 g/dL (ref 30.0–36.0)
MCV: 80 fL (ref 80.0–100.0)
Monocytes Absolute: 0.8 10*3/uL (ref 0.1–1.0)
Monocytes Relative: 10 %
Neutro Abs: 5 10*3/uL (ref 1.7–7.7)
Neutrophils Relative %: 61 %
Platelets: 377 10*3/uL (ref 150–400)
RBC: 4.05 MIL/uL (ref 3.87–5.11)
RDW: 14.7 % (ref 11.5–15.5)
WBC: 8.2 10*3/uL (ref 4.0–10.5)
nRBC: 0 % (ref 0.0–0.2)

## 2020-08-16 LAB — COMPREHENSIVE METABOLIC PANEL
ALT: 21 U/L (ref 0–44)
AST: 24 U/L (ref 15–41)
Albumin: 3.9 g/dL (ref 3.5–5.0)
Alkaline Phosphatase: 70 U/L (ref 38–126)
Anion gap: 5 (ref 5–15)
BUN: 12 mg/dL (ref 6–20)
CO2: 28 mmol/L (ref 22–32)
Calcium: 9.2 mg/dL (ref 8.9–10.3)
Chloride: 106 mmol/L (ref 98–111)
Creatinine, Ser: 0.76 mg/dL (ref 0.44–1.00)
GFR, Estimated: >60 mL/min (ref >60)
Glucose, Bld: 112 mg/dL — ABNORMAL HIGH (ref 70–99)
Potassium: 3.8 mmol/L (ref 3.5–5.1)
Sodium: 139 mmol/L (ref 135–145)
Total Bilirubin: 0.7 mg/dL (ref 0.3–1.2)
Total Protein: 7.2 g/dL (ref 6.5–8.1)

## 2020-08-16 LAB — RESP PANEL BY RT-PCR (FLU A&B, COVID) ARPGX2
Influenza A by PCR: NEGATIVE
Influenza B by PCR: NEGATIVE
SARS Coronavirus 2 by RT PCR: NEGATIVE

## 2020-08-16 LAB — CK: Total CK: 206 U/L (ref 38–234)

## 2020-08-16 MED ORDER — KETOROLAC TROMETHAMINE 30 MG/ML IJ SOLN
30.0000 mg | Freq: Once | INTRAMUSCULAR | Status: AC
  Administered 2020-08-16: 30 mg via INTRAVENOUS

## 2020-08-16 MED ORDER — TRAMADOL HCL 50 MG PO TABS: 50.0000 mg | ORAL_TABLET | Freq: Four times a day (QID) | ORAL | 0 refills | Status: AC | PRN

## 2020-08-16 MED ORDER — KETOROLAC TROMETHAMINE 30 MG/ML IJ SOLN
30.0000 mg | Freq: Once | INTRAMUSCULAR | Status: DC
  Filled 2020-08-16: qty 1

## 2020-08-16 MED ORDER — TRAMADOL HCL 50 MG PO TABS
50.0000 mg | ORAL_TABLET | Freq: Once | ORAL | Status: AC
  Administered 2020-08-16: 50 mg via ORAL
  Filled 2020-08-16 (×2): qty 1

## 2020-08-16 MED ORDER — SODIUM CHLORIDE 0.9 % IV BOLUS
500.0000 mL | Freq: Once | INTRAVENOUS | Status: AC
  Administered 2020-08-16: 500 mL via INTRAVENOUS

## 2020-08-16 NOTE — ED Notes (Signed)
Lab recollecting CBC sample now

## 2020-08-16 NOTE — ED Triage Notes (Signed)
Pt reports that she has had body aches for the last several days. She states that doing her daily activities she would get pain all over her body. She has history of fiber myalgia and arthritis. She worked in the yard yesterday and is now having sinus pressure and congestion.

## 2020-08-16 NOTE — ED Notes (Signed)
Unsuccessful IV attempt x2, once in right forearm and once in left hand. Gauze bandage applied to both sites.

## 2020-08-16 NOTE — Discharge Instructions (Addendum)
Please rest and stay hydrated at home. You can take Tramadol at home for pain.

## 2020-08-16 NOTE — ED Provider Notes (Signed)
[facility]-EMERGENCY DEPARTMENT  ____________________________________________  Time seen: Approximately 5:15 PM  I have reviewed the triage vital signs and the nursing notes.   HISTORY  Chief Complaint bodyaches   Historian Patient     HPI Leah Dodson is a 45 y.o. female presents to the emergency department with headache and diffuse body aches for the past 2 days.  Patient reports difficulty sleeping.  She is also had some nasal congestion.  Patient reports that she has a history of chronic pain and has pain at baseline but reports that her current body aches are keeping her from activities of daily living.  Denies chest pain, chest tightness or abdominal pain.  No falls or mechanisms of trauma.  No fever noted at home.  No recent travel.  No other alleviating measures have been attempted.   Past Medical History:  Diagnosis Date   Disturbance of sleep    GERD (gastroesophageal reflux disease)    History of hiatal hernia    Preeclampsia      Immunizations up to date:  Yes.     Past Medical History:  Diagnosis Date   Disturbance of sleep    GERD (gastroesophageal reflux disease)    History of hiatal hernia    Preeclampsia     There are no problems to display for this patient.   Past Surgical History:  Procedure Laterality Date   CHOLECYSTECTOMY     COLONOSCOPY WITH PROPOFOL N/A 03/13/2015   Procedure: COLONOSCOPY WITH PROPOFOL;  Surgeon: Wallace Cullens, MD;  Location: [facility] ENDOSCOPY;  Service: Gastroenterology;  Laterality: N/A;   csections     ESOPHAGOGASTRODUODENOSCOPY (EGD) WITH PROPOFOL N/A 03/13/2015   Procedure: ESOPHAGOGASTRODUODENOSCOPY (EGD) WITH PROPOFOL;  Surgeon: Wallace Cullens, MD;  Location: [facility] ENDOSCOPY;  Service: Gastroenterology;  Laterality: N/A;    Prior to Admission medications   Medication Sig Start Date End Date Taking? Authorizing Provider  traMADol (ULTRAM) 50 MG tablet Take 1 tablet (50 mg total) by mouth every 6 (six) hours as needed for up to  3 days. 08/16/20 08/19/20 Yes Pia Mau M, PA-C  Hyoscyamine Sulfate (HYOSCYAMINE PO) Take 1 tablet by mouth as needed.    [provider]  omeprazole (PRILOSEC) 40 MG capsule Take 40 mg by mouth daily.    [provider]    Allergies Patient has no known allergies.  No family history on file.  Social History Social History   Tobacco Use   Smoking status: Never     Review of Systems  Constitutional: No fever/chills Eyes:  No discharge ENT: No upper respiratory complaints. Respiratory: no cough. No SOB/ use of accessory muscles to breath Gastrointestinal:   No nausea, no vomiting.  No diarrhea.  No constipation. Musculoskeletal: Patient has myalgias.  Skin: Negative for rash, abrasions, lacerations, ecchymosis.   ____________________________________________   PHYSICAL EXAM:  VITAL SIGNS: ED Triage Vitals  Enc Vitals Group     BP 08/16/20 1625 (!) 141/78     Pulse Rate 08/16/20 1625 98     Resp 08/16/20 1625 18     Temp 08/16/20 1625 98.4 F (36.9 C)     Temp Source 08/16/20 1625 Oral     SpO2 08/16/20 1625 98 %     Weight 08/16/20 1627 248 lb 0.3 oz (112.5 kg)     Height 08/16/20 1627 5\' 2"  (1.575 m)     Head Circumference --      Peak Flow --      Pain Score 08/16/20 1627 7  Pain Loc --      Pain Edu? --      Excl. in GC? --      Constitutional: Alert and oriented. Well appearing and in no acute distress. Eyes: Conjunctivae are normal. PERRL. EOMI. Head: Atraumatic. ENT:      Nose: No congestion/rhinnorhea.      Mouth/Throat: Mucous membranes are moist.  Neck: No stridor.  No cervical spine tenderness to palpation. Hematological/Lymphatic/Immunilogical: No cervical lymphadenopathy.  Cardiovascular: Normal rate, regular rhythm. Normal S1 and S2.  Good peripheral circulation. Respiratory: Normal respiratory effort without tachypnea or retractions. Lungs CTAB. Good air entry to the bases with no decreased or absent breath  sounds Gastrointestinal: Bowel sounds x 4 quadrants. Soft and nontender to palpation. No guarding or rigidity. No distention. Musculoskeletal: Full range of motion to all extremities. No obvious deformities noted Neurologic:  Normal for age. No gross focal neurologic deficits are appreciated.  Skin:  Skin is warm, dry and intact. No rash noted. Psychiatric: Mood and affect are normal for age. Speech and behavior are normal.   ____________________________________________   LABS (all labs ordered are listed, but only abnormal results are displayed)  Labs Reviewed  COMPREHENSIVE METABOLIC PANEL - Abnormal; Notable for the following components:      Result Value   Glucose, Bld 112 (*)    All other components within normal limits  CBC WITH DIFFERENTIAL/PLATELET - Abnormal; Notable for the following components:   Hemoglobin 11.1 (*)    HCT 32.4 (*)    All other components within normal limits  RESP PANEL BY RT-PCR (FLU A&B, COVID) ARPGX2  CK  CBC WITH DIFFERENTIAL/PLATELET   ____________________________________________  EKG   ____________________________________________  RADIOLOGY   No results found.  ____________________________________________    PROCEDURES  Procedure(s) performed:     Procedures     Medications  sodium chloride 0.9 % bolus 500 mL (0 mLs Intravenous Stopped 08/16/20 1913)  traMADol (ULTRAM) tablet 50 mg (50 mg Oral Given 08/16/20 2222)  ketorolac (TORADOL) 30 MG/ML injection 30 mg (30 mg Intravenous Given 08/16/20 2222)     ____________________________________________   INITIAL IMPRESSION / ASSESSMENT AND PLAN / ED COURSE  Pertinent labs & imaging results that were available during my care of the patient were reviewed by me and considered in my medical decision making (see chart for details).      Assessment and Plan:  Myalgias:  45 year old female presents to the emergency department with body aches and a flare of her  fibromyalgia.  Vital signs are reassuring at triage.  On physical exam, patient was alert, active and nontoxic-appearing.  Differential diagnosis included influenza, COVID-19, rhabdo, fibromyalgia, electrolyte abnormality...  CBC and CMP were reassuring.  COVID-19 is negative.  Influenza testing was negative.  CK was within reference range.  Patient reports that she has tolerated tramadol well in the past and has typically used this medication for fibromyalgia flares.  Patient was prescribed a short course of tramadol.  Return precautions were given to return with new or worsening symptoms.    ____________________________________________  FINAL CLINICAL IMPRESSION(S) / ED DIAGNOSES  Final diagnoses:  Myalgia      NEW MEDICATIONS STARTED DURING THIS VISIT:  ED Discharge Orders          Ordered    traMADol (ULTRAM) 50 MG tablet  Every 6 hours PRN        08/16/20 2212                This chart was  dictated using voice recognition software/Dragon. Despite best efforts to proofread, errors can occur which can change the meaning. Any change was purely unintentional.     Orvil Feil, PA-C 08/16/20 2305    Phineas Semen, MD 08/16/20 [phone]

## 2020-10-18 ENCOUNTER — Emergency Department: Payer: 59

## 2020-10-18 ENCOUNTER — Emergency Department
Admission: EM | Admit: 2020-10-18 | Discharge: 2020-10-18 | Disposition: A | Discharge status: Home / Self Care | Payer: 59 | Attending: Emergency Medicine | Admitting: Emergency Medicine

## 2020-10-18 ENCOUNTER — Other Ambulatory Visit: Payer: Self-pay

## 2020-10-18 DIAGNOSIS — S62346A Nondisplaced fracture of base of fifth metacarpal bone, right hand, initial encounter for closed fracture: Secondary | ICD-10-CM | POA: Insufficient documentation

## 2020-10-18 DIAGNOSIS — W1830XA Fall on same level, unspecified, initial encounter: Secondary | ICD-10-CM | POA: Insufficient documentation

## 2020-10-18 DIAGNOSIS — S6991XA Unspecified injury of right wrist, hand and finger(s), initial encounter: Secondary | ICD-10-CM | POA: Diagnosis present

## 2020-10-18 IMAGING — DX DG WRIST COMPLETE 3+V*R*
4 series · 4 of 4 positions shown · non-contrast
Comparison: None.

CLINICAL DATA: Persistent pain after fall 1 week ago.

EXAM:
RIGHT WRIST - COMPLETE 3+ VIEW

[wrist ap (1 of 2)]
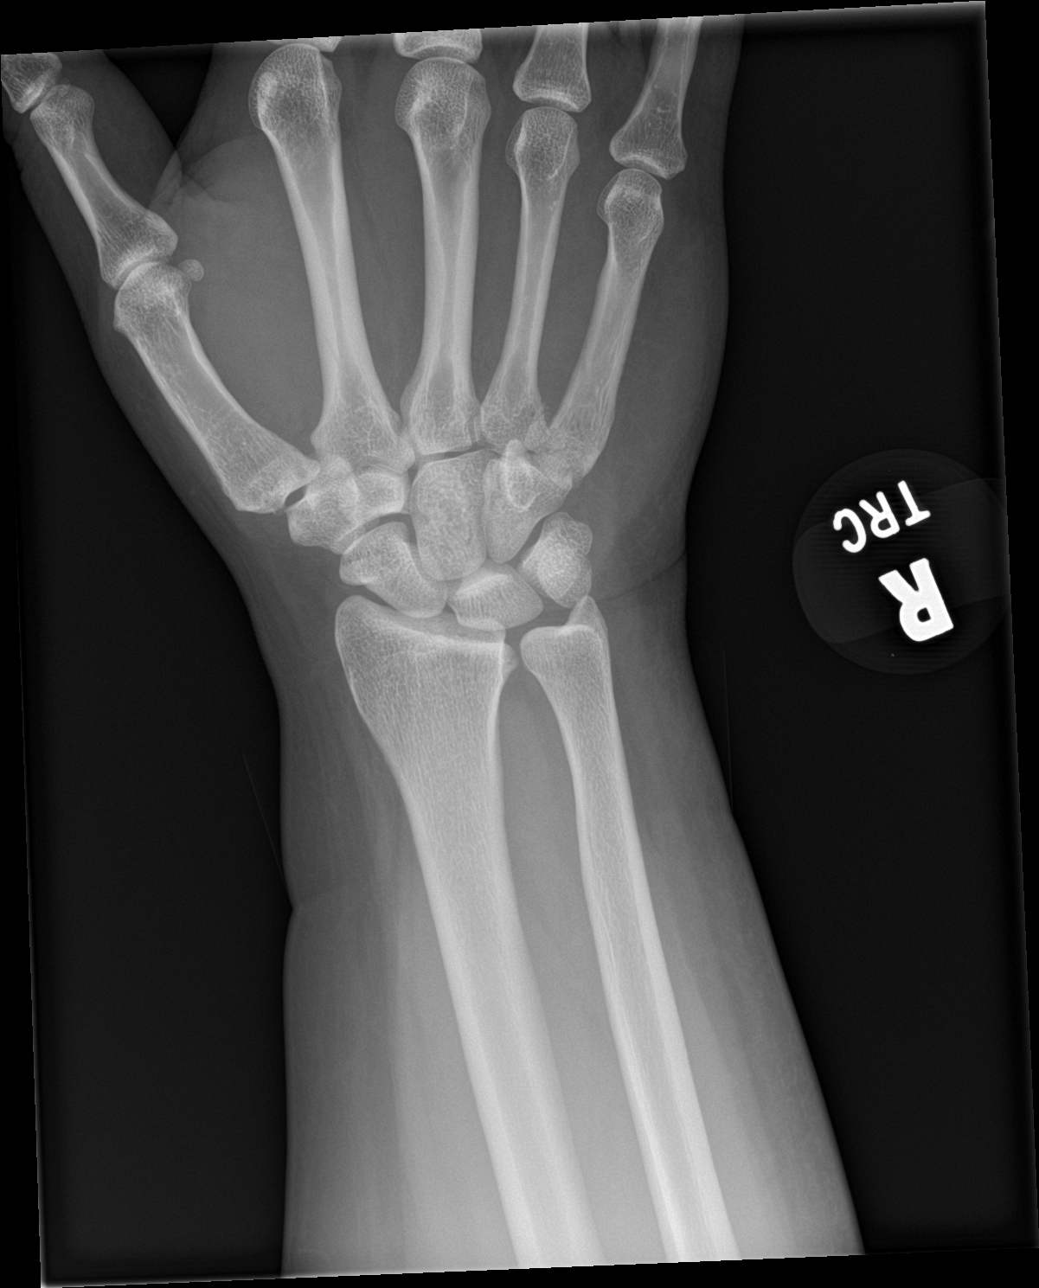

[wrist obl]
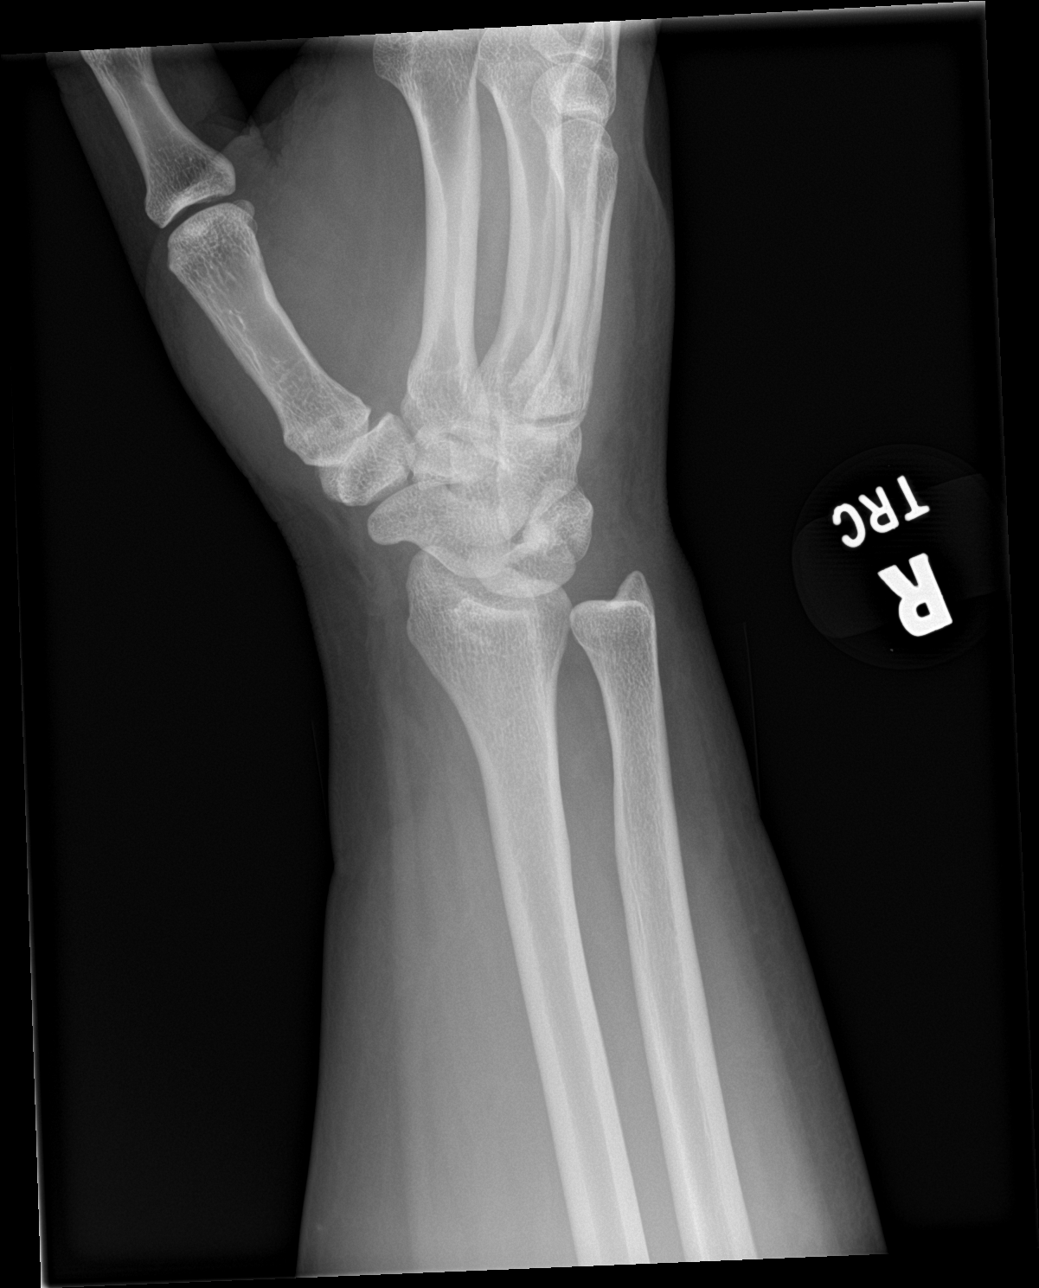

[wrist lat]
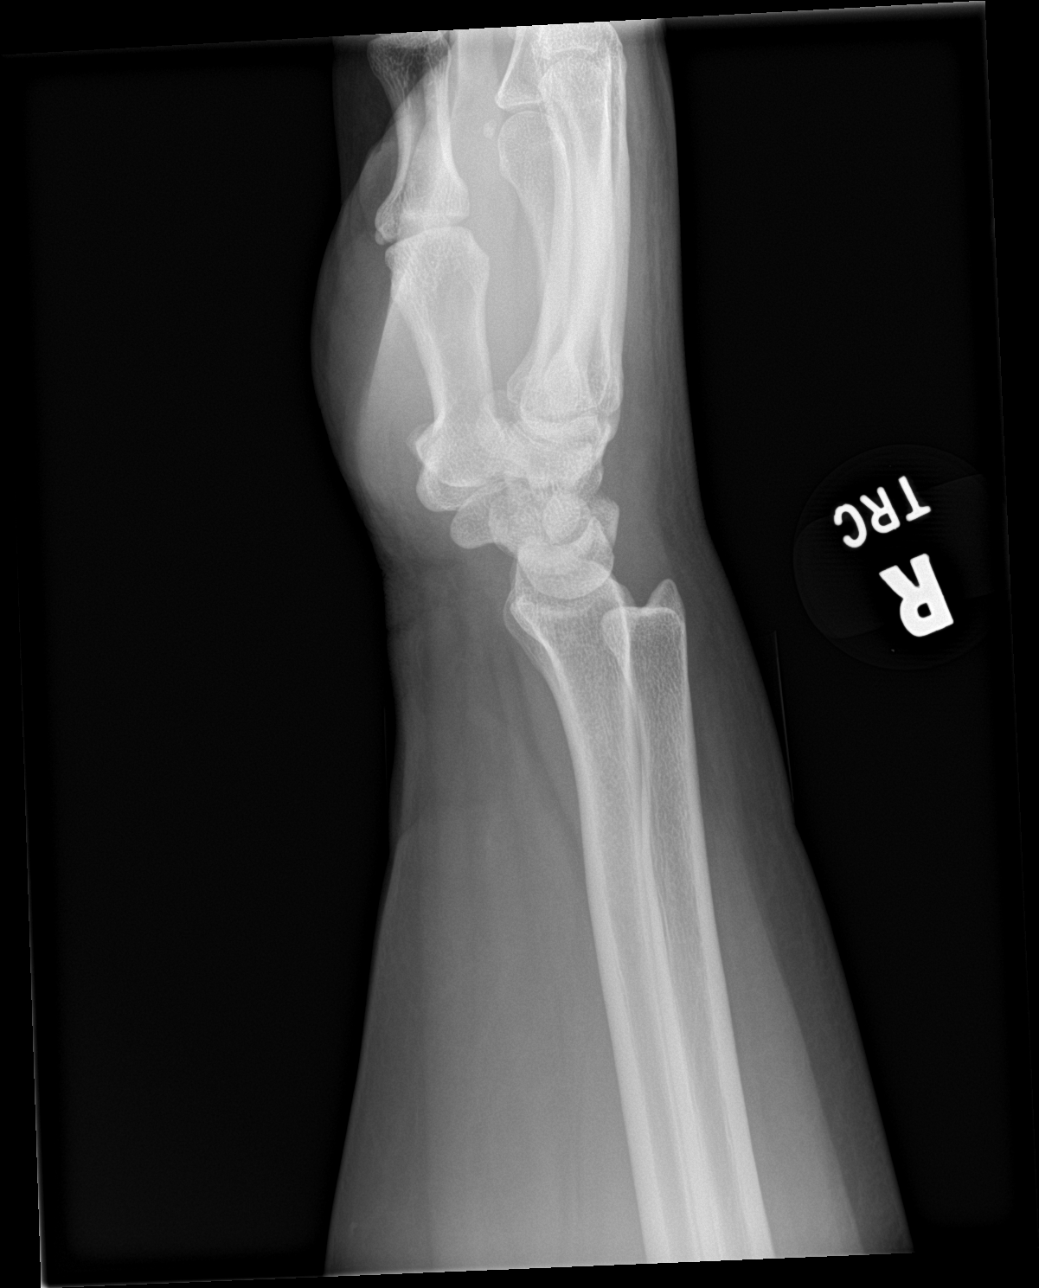

[wrist ap (2 of 2)]
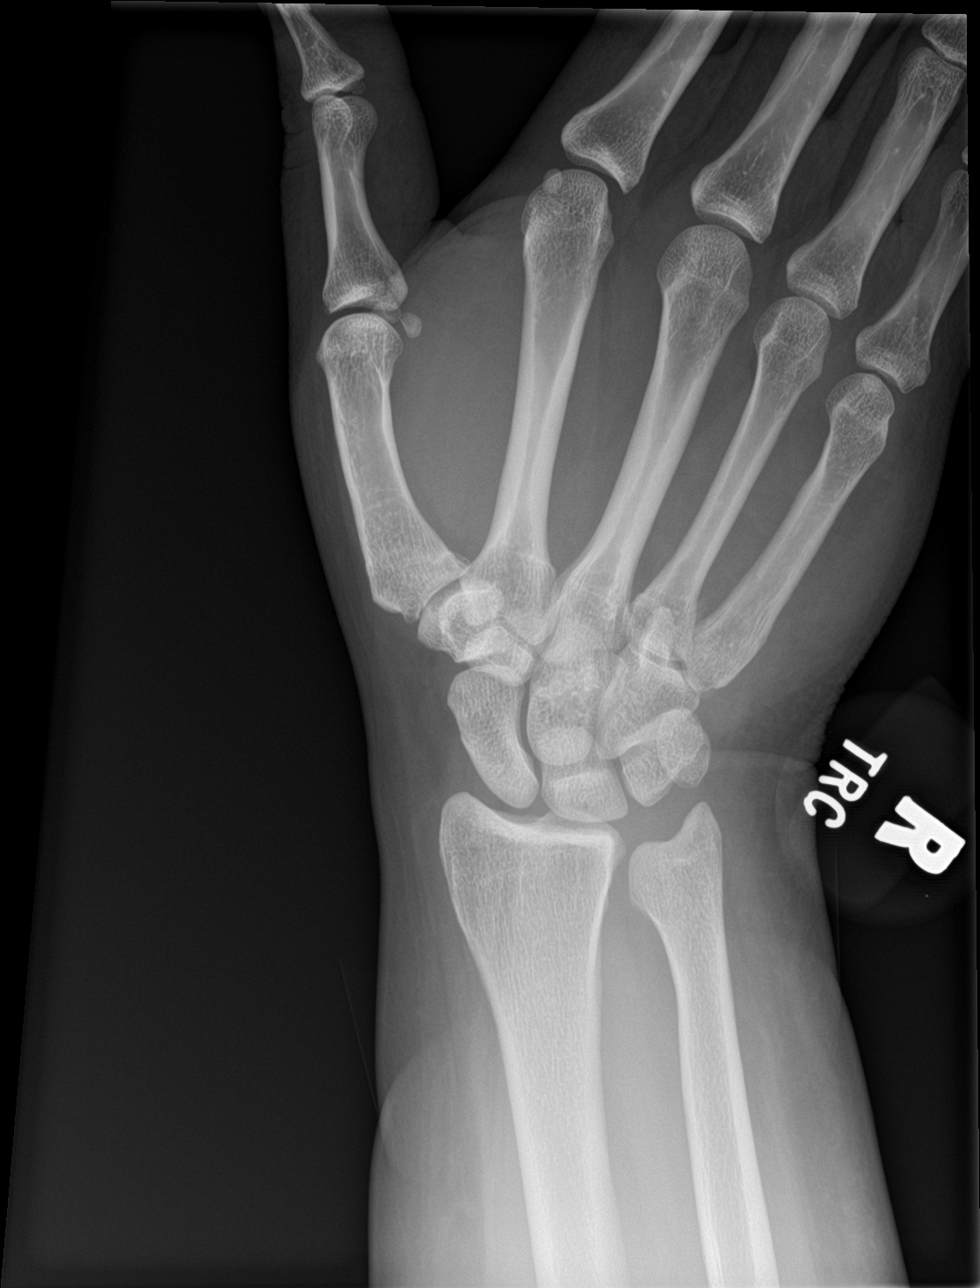

[4 of 4 positions shown; findings below may reference images not displayed]

FINDINGS: Displaced avulsion fracture fragment interposed between the bases of
the fourth and fifth metacarpal bones. Carpal bones appear intact
and normally aligned throughout. Distal radius and ulna appear
intact and normally aligned.
IMPRESSION: Displaced avulsion fracture fragment interposed between the bases of
the fourth and fifth metacarpal bones, of uncertain origin,
suspected to be avulsed from the base of the fifth metacarpal base.
Consider CT of the wrist for more definitive characterization.

## 2020-10-18 IMAGING — DX DG HAND COMPLETE 3+V*R*
3 series · 3 of 3 positions shown · non-contrast
Comparison: None.

CLINICAL DATA: Fall a week ago, ongoing hand and wrist pain.

EXAM:
RIGHT HAND - COMPLETE 3+ VIEW

[hand ap]
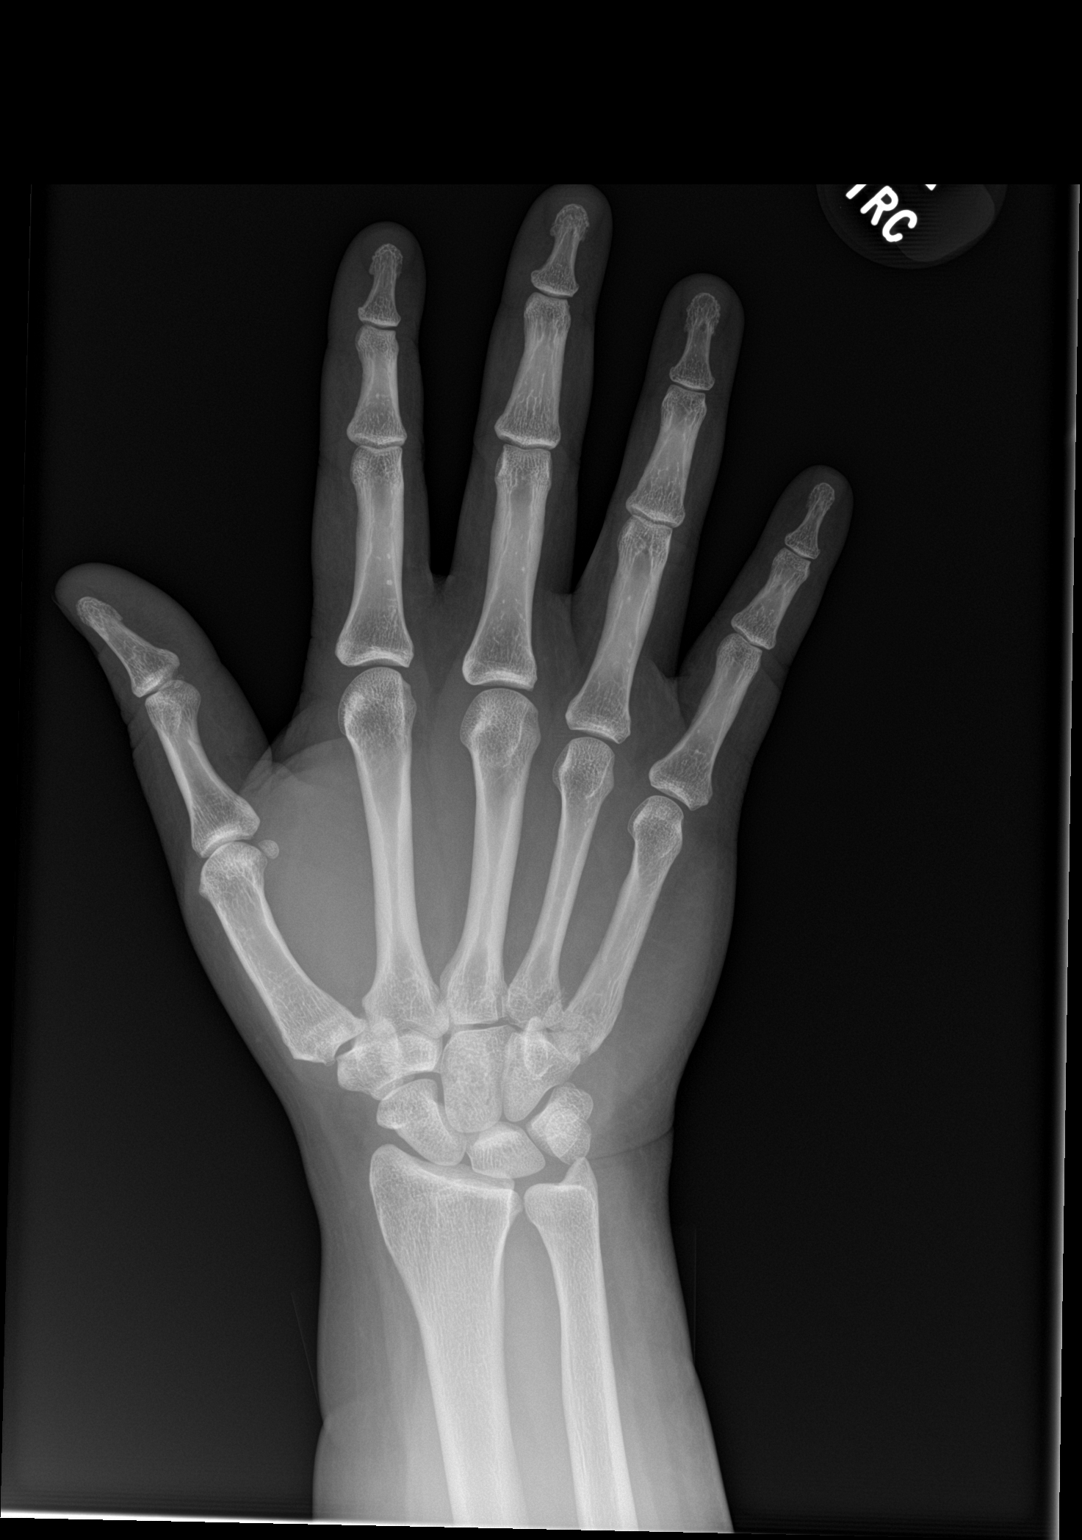

[hand obl]
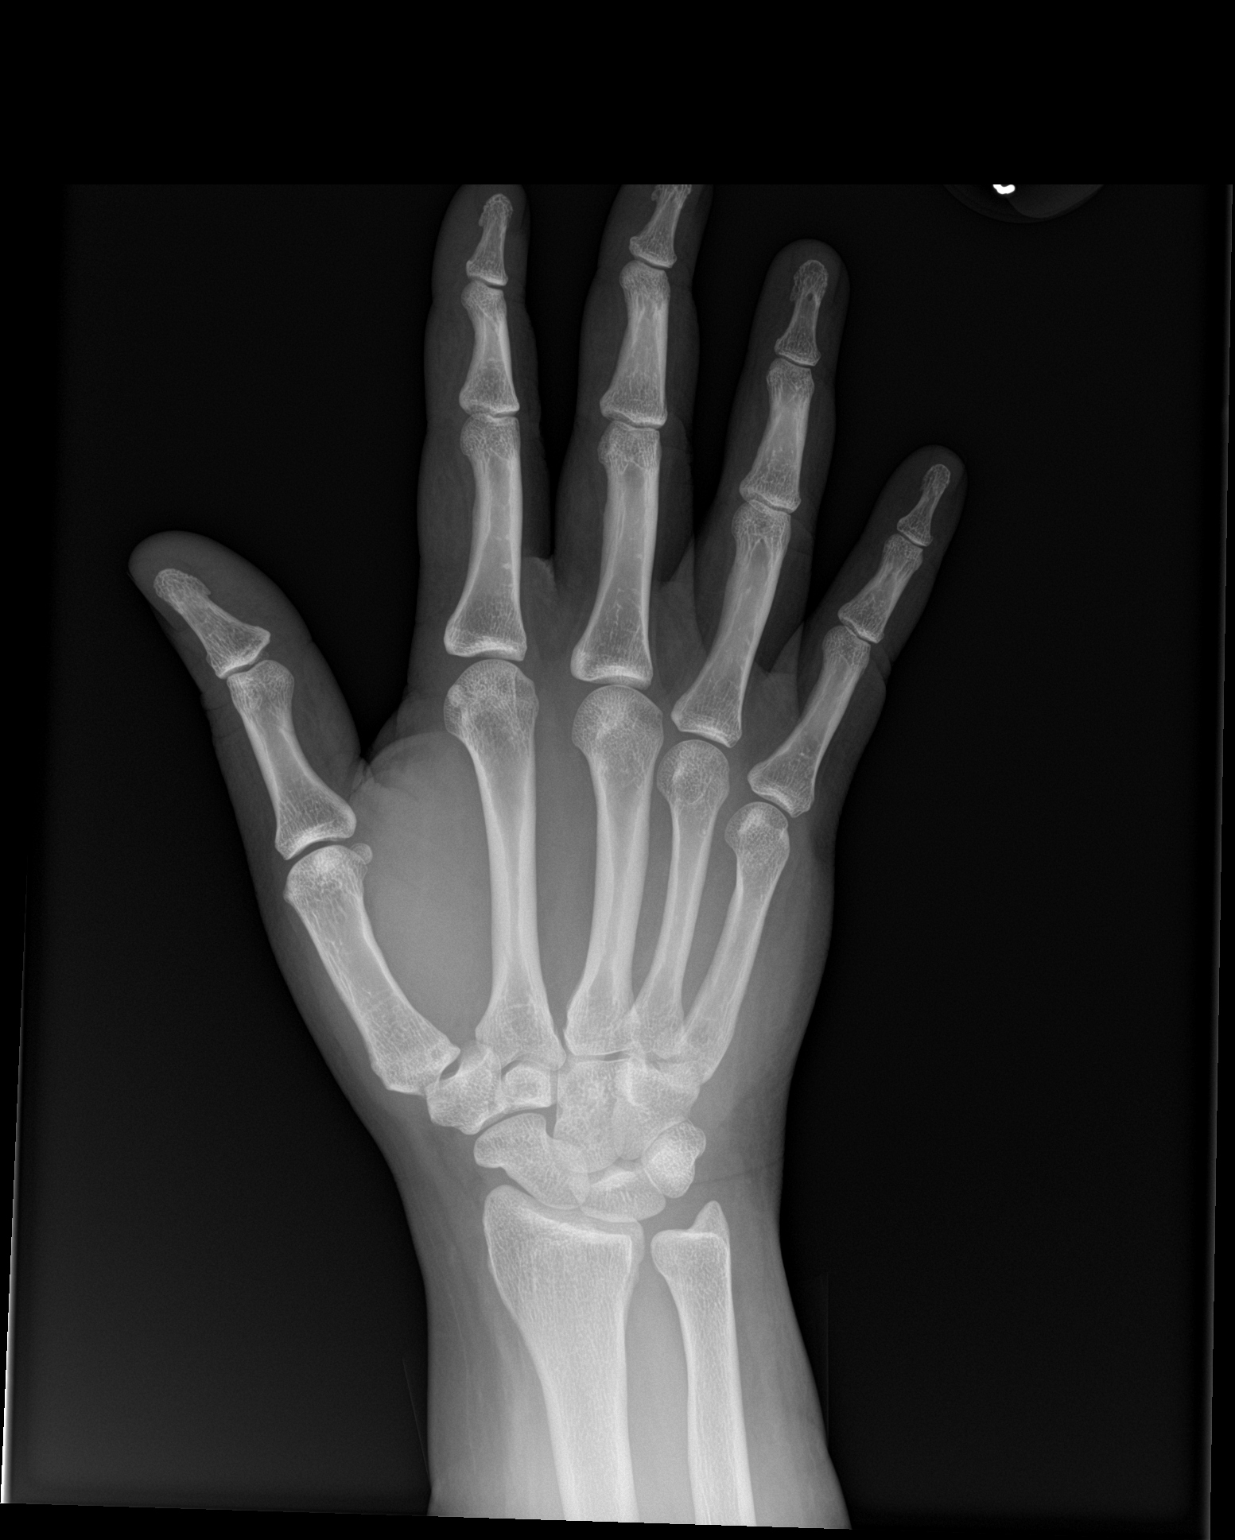

[hand lat]
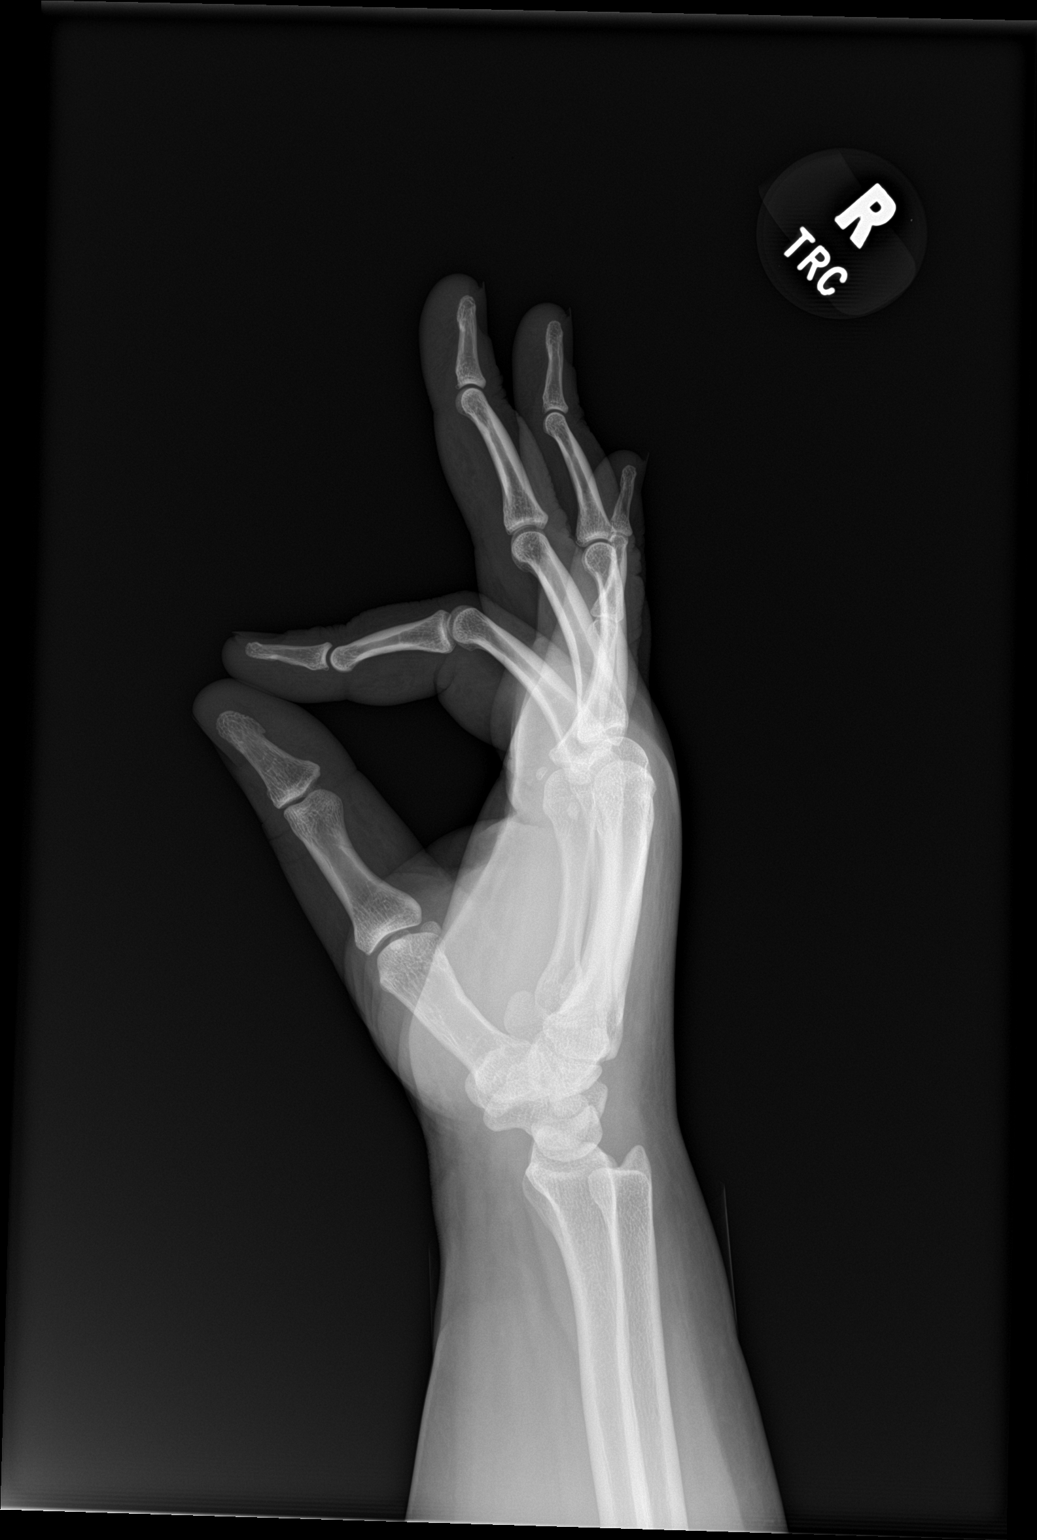

[3 of 3 positions shown; findings below may reference images not displayed]

FINDINGS: Displaced fracture fragment interposed between the bases of the
fourth and fifth metacarpal bones, of uncertain origin.

Osseous structures appear otherwise intact and normally aligned
throughout.
IMPRESSION: Displaced fracture fragment interposed between the bases of the
fourth and fifth metacarpal bones, of uncertain origin, suspect
avulsion from the base of the fifth metacarpal bone.

## 2020-10-18 IMAGING — CT CT WRIST*R* W/O CM
3 series · 12 of 35 positions shown, 14 images · non-contrast
Comparison: Wrist films from earlier in the same day.

CLINICAL DATA: Fracture fragment between the bases of the fourth
and fifth metacarpals, evaluate for location

EXAM:
CT OF THE RIGHT WRIST WITHOUT CONTRAST
TECHNIQUE: Multidetector CT imaging of the right wrist was performed according
to the standard protocol. Multiplanar CT image reconstructions were
also generated.

[Series 7: axial st · axial · 0.13mm/px · z∈[-412,-317]mm · 4 of 137 slices shown, 5 images]
[im 21/137  soft-tissue]
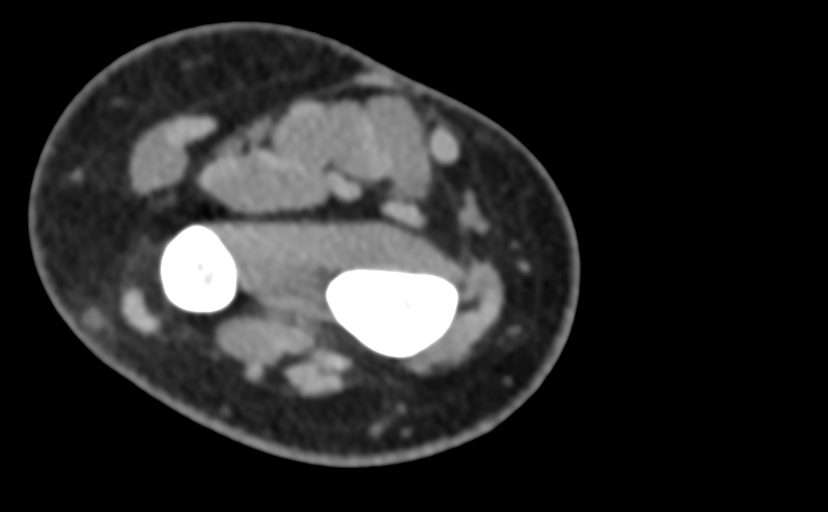
[im 21/137  bone]
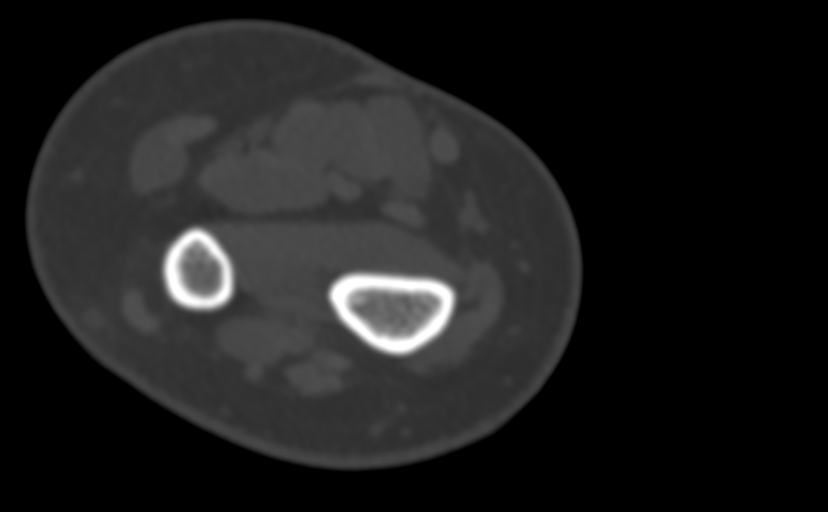
[im 53/137  bone]
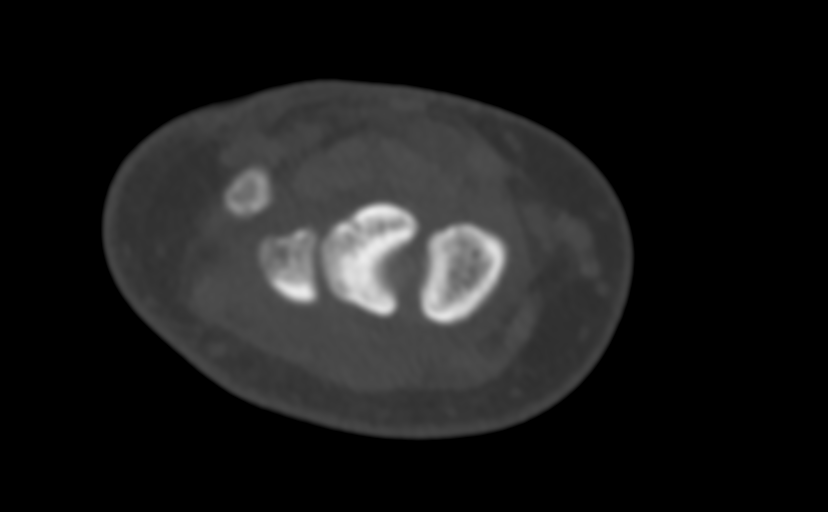
[im 84/137  bone]
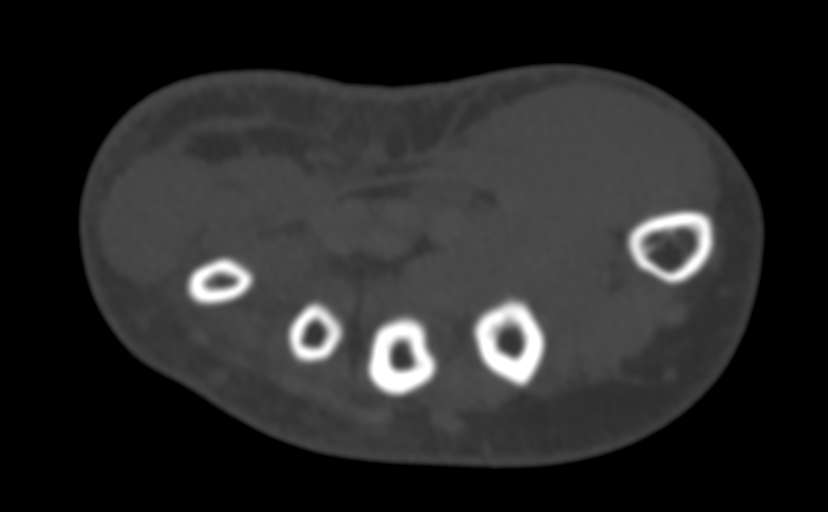
[im 116/137  bone]
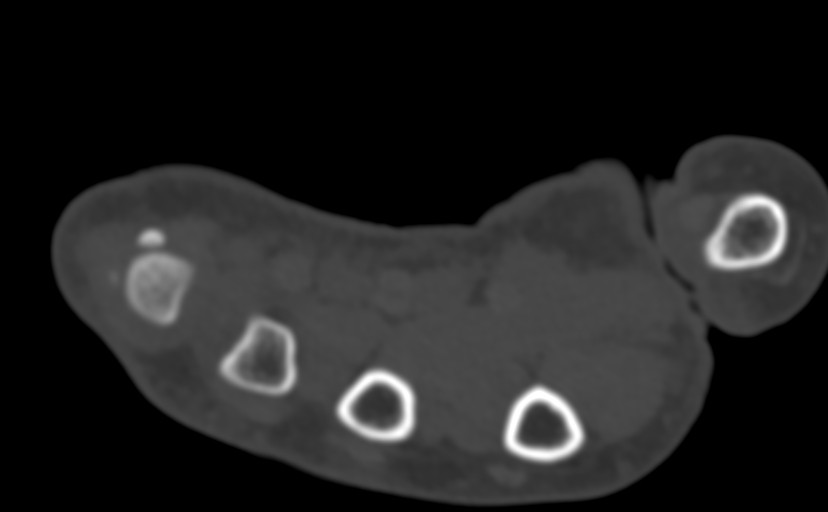

[Series 8: cor st · coronal · 0.21mm/px · 3 of 69 slices shown]
[im 14/69  bone]
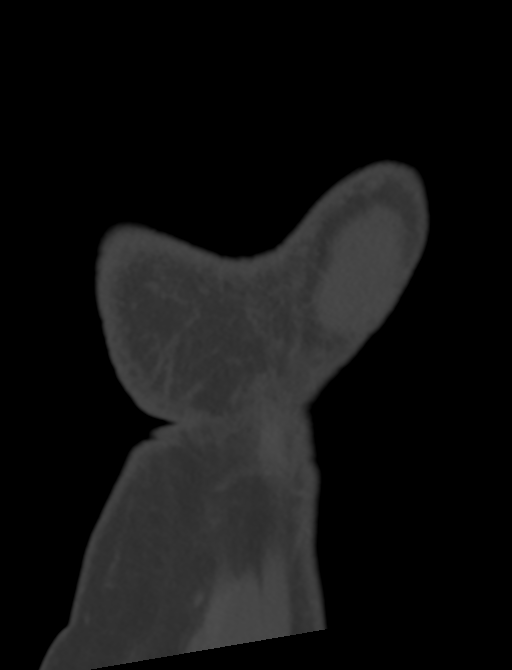
[im 28/69  bone]
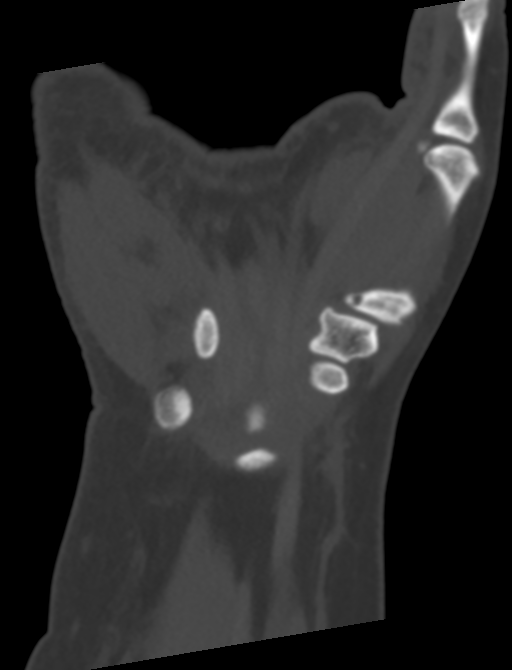
[im 41/69  bone]
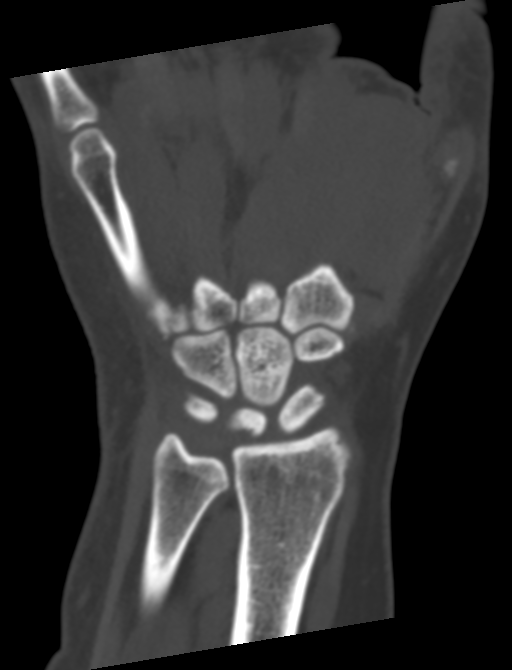

[Series 9: sag st · sagittal · 0.21mm/px · 5 of 119 slices shown, 6 images]
[im 40/119  bone]
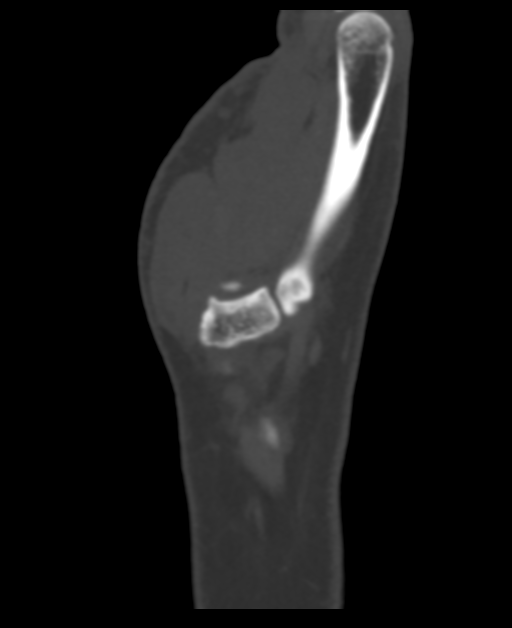
[im 50/119  bone]
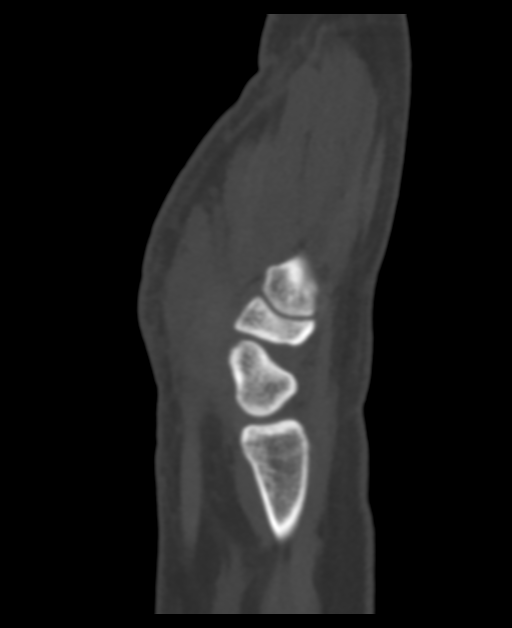
[im 60/119  soft-tissue]
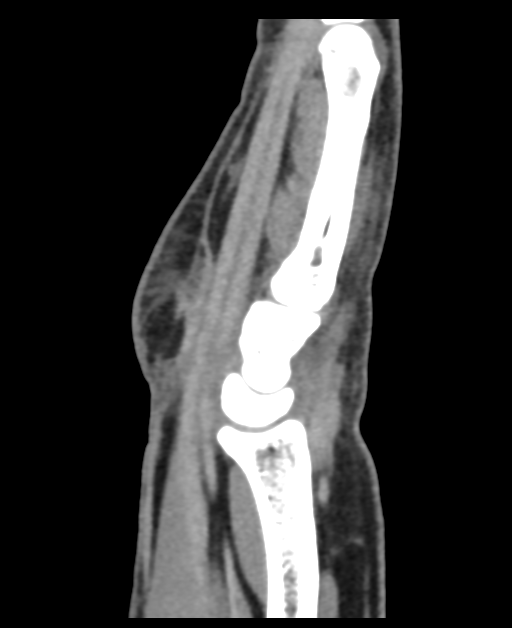
[im 60/119  bone]
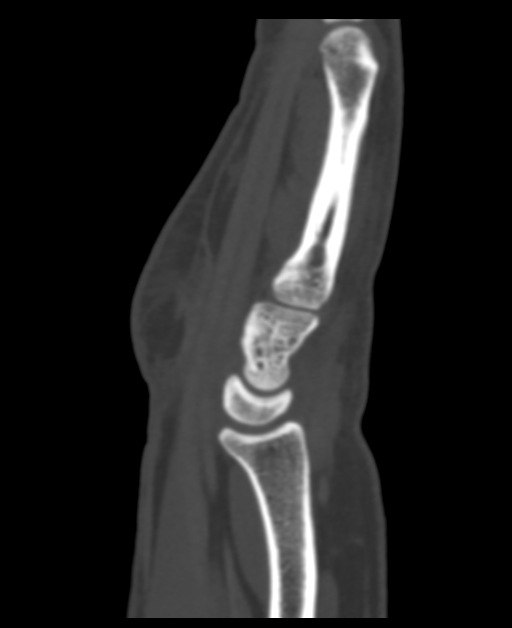
[im 69/119  bone]
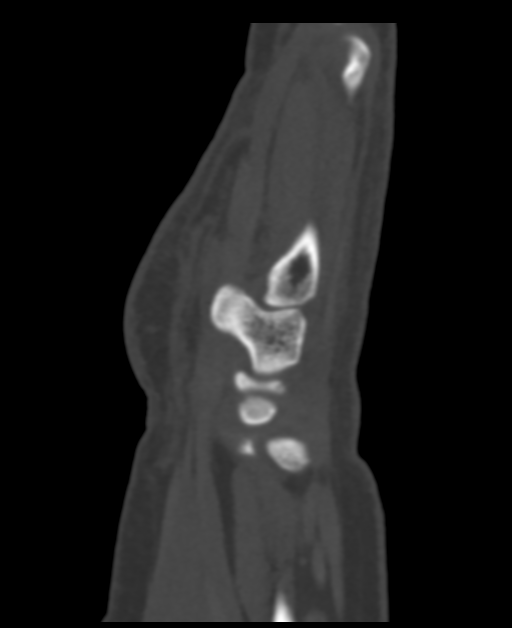
[im 79/119  bone]
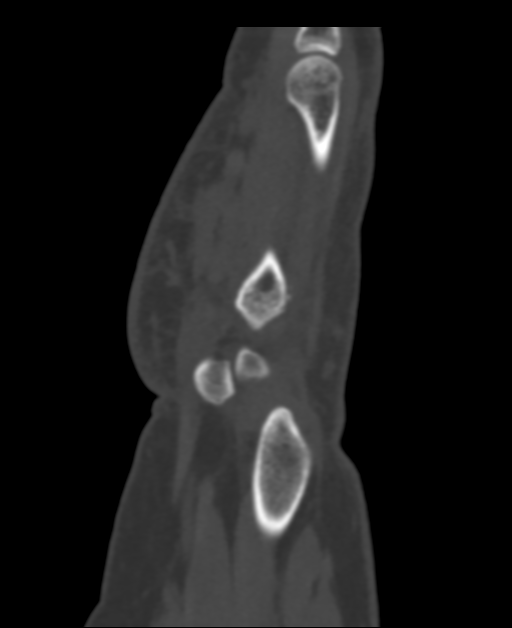

[12 of 35 positions shown; findings below may reference images not displayed]

FINDINGS: Bones/Joint/Cartilage

There is an avulsion fracture identified along the lateral aspect of
the base of the fifth metacarpal which corresponds to that seen on
recent plain film. It extends into the articular surface with the
hamate. The remainder of the metacarpals appear within normal
limits. Mild degenerative changes at the first [facility] joint are seen.
Carpal bones as well as the distal radius and ulna are unremarkable.

Ligaments

Suboptimally assessed by CT.

Muscles and Tendons

Musculature appears within normal limits.

Soft tissues

Mild soft tissue swelling is noted related to the fracture. No
sizable hematoma is seen.
IMPRESSION: Fracture seen on plain film arises from the lateral aspect of the
base of the fifth metatarsal.

No other fractures are seen.

## 2020-10-18 MED ORDER — HYDROCODONE-ACETAMINOPHEN 5-325 MG PO TABS: 1.0000 | ORAL_TABLET | ORAL | 0 refills | Status: AC | PRN

## 2020-10-18 MED ORDER — MELOXICAM 15 MG PO TABS: 15.0000 mg | ORAL_TABLET | Freq: Every day | ORAL | 0 refills | Status: AC

## 2020-10-18 NOTE — ED Provider Notes (Signed)
[facility] Emergency Department Provider Note  ____________________________________________  Time seen: Approximately 6:31 PM  I have reviewed the triage vital signs and the nursing notes.   HISTORY  Chief Complaint Hand Pain    HPI Leah Dodson is a 45 y.o. female who presents emergency department complains of pain to the right hand and wrist with shooting pain between the fourth and fifth fingers up into her elbow and shoulder.  Patient had a mechanical fall a week ago, fell onto bilateral outstretched hands but the majority of patient's weight landed on her Right hand and wrist. Denies pain in upper arm or shoulder.  .  She had significant swelling initially with severe pain primarily located along the medial aspect of the hand over the fourth and fifth metacarpal region.  She still has good range of motion to the fingers.  Edema has been improving but pain has not.  Patient is also reporting shooting sensation as well as numbness and tingling that runs between the fourth and fifth fingers all the way up to the elbow and shoulder.  No pain in the forearm, elbow       Past Medical History:  Diagnosis Date   Disturbance of sleep    GERD (gastroesophageal reflux disease)    History of hiatal hernia    Preeclampsia     There are no problems to display for this patient.   Past Surgical History:  Procedure Laterality Date   CHOLECYSTECTOMY     COLONOSCOPY WITH PROPOFOL N/A 03/13/2015   Procedure: COLONOSCOPY WITH PROPOFOL;  Surgeon: Wallace CullensPaul Y Oh, MD;  [facility];  Service: Gastroenterology;  Laterality: N/A;   csections     ESOPHAGOGASTRODUODENOSCOPY (EGD) WITH PROPOFOL N/A 03/13/2015   Procedure: ESOPHAGOGASTRODUODENOSCOPY (EGD) WITH PROPOFOL;  Surgeon: Wallace CullensPaul Y Oh, MD;  [facility];  Service: Gastroenterology;  Laterality: N/A;    Prior to Admission medications   Medication Sig Start Date End Date Taking? Authorizing Provider   HYDROcodone-acetaminophen (NORCO/VICODIN) 5-325 MG tablet Take 1 tablet by mouth every 4 (four) hours as needed for moderate pain. 10/18/20 10/18/21 Yes Kristin Barcus, Delorise RoyalsJonathan D, PA-C  meloxicam (MOBIC) 15 MG tablet Take 1 tablet (15 mg total) by mouth daily. 10/18/20  Yes Ryosuke Ericksen, Delorise RoyalsJonathan D, PA-C  Hyoscyamine Sulfate (HYOSCYAMINE PO) Take 1 tablet by mouth as needed.    [provider]  omeprazole (PRILOSEC) 40 MG capsule Take 40 mg by mouth daily.    [provider]    Allergies Patient has no known allergies.  No family history on file.  Social History Social History   Tobacco Use   Smoking status: Never     Review of Systems  Constitutional: No fever/chills Eyes: No visual changes. No discharge ENT: No upper respiratory complaints. Cardiovascular: no chest pain. Respiratory: no cough. No SOB. Gastrointestinal: No abdominal pain.  No nausea, no vomiting.  No diarrhea.  No constipation. Musculoskeletal: Pain secondary to a fall in the right hand and wrist with shooting pains between the fourth and fifth fingers all the way up to her shoulder Skin: Negative for rash, abrasions, lacerations, ecchymosis. Neurological: Negative for headaches, focal weakness or numbness.  10 System ROS otherwise negative.  ____________________________________________   PHYSICAL EXAM:  VITAL SIGNS: ED Triage Vitals  Enc Vitals Group     BP 10/18/20 1617 131/76     Pulse Rate 10/18/20 1617 81     Resp 10/18/20 1617 18     Temp 10/18/20 1617 98.1 F (36.7  C)     Temp Source 10/18/20 1617 Oral     SpO2 10/18/20 1617 100 %     Weight --      Height --      Head Circumference --      Peak Flow --      Pain Score 10/18/20 1558 5     Pain Loc --      Pain Edu? --      Excl. in GC? --      Constitutional: Alert and oriented. Well appearing and in no acute distress. Eyes: Conjunctivae are normal. PERRL. EOMI. Head: Atraumatic. ENT:      Ears:       Nose: No  congestion/rhinnorhea.      Mouth/Throat: Mucous membranes are moist.  Neck: No stridor.  No cervical spine tenderness to palpation.  Cardiovascular: Normal rate, regular rhythm. Normal S1 and S2.  Good peripheral circulation. Respiratory: Normal respiratory effort without tachypnea or retractions. Lungs CTAB. Good air entry to the bases with no decreased or absent breath sounds. Musculoskeletal: Full range of motion to all extremities. No gross deformities appreciated.  Visualization of the hand reveals no gross edema, ecchymosis, open wounds, deformity.  Good range of motion all digits.  Patient is very tender to palpation over the bases of the fourth and fifth metacarpal extending into the carpals and ulna.  There is no palpable abnormality.  Sensation is intact the patient reports slightly decreased sensation to the fourth and fifth digit when compared with other digits.  Capillary refill less than 2 seconds all digits.  Examination of the forearm, elbow, upper arm is unremarkable on the right side. Neurologic:  Normal speech and language. No gross focal neurologic deficits are appreciated.  Skin:  Skin is warm, dry and intact. No rash noted. Psychiatric: Mood and affect are normal. Speech and behavior are normal. Patient exhibits appropriate insight and judgement.   ____________________________________________   LABS (all labs ordered are listed, but only abnormal results are displayed)  Labs Reviewed - No data to display ____________________________________________  EKG   ____________________________________________  RADIOLOGY I personally viewed and evaluated these images as part of my medical decision making, as well as reviewing the written report by the radiologist.  ED Provider Interpretation: Fracture of the base of the fifth metacarpal.  Initial x-ray revealed findings consistent with possible fracture fragment.  Given the location with numbness along the ulnar nerve  distribution I did pursue CT scan.  This confirms fracture without significant displacement.  DG Wrist Complete Right  Result Date: 10/18/2020 CLINICAL DATA:  Persistent pain after fall 1 week ago. EXAM: RIGHT WRIST - COMPLETE 3+ VIEW COMPARISON:  None. FINDINGS: Displaced avulsion fracture fragment interposed between the bases of the fourth and fifth metacarpal bones. Carpal bones appear intact and normally aligned throughout. Distal radius and ulna appear intact and normally aligned. IMPRESSION: Displaced avulsion fracture fragment interposed between the bases of the fourth and fifth metacarpal bones, of uncertain origin, suspected to be avulsed from the base of the fifth metacarpal base. Consider CT of the wrist for more definitive characterization. Electronically Signed   By: Bary Richard M.D.   On: 10/18/2020 18:54   CT Wrist Right Wo Contrast  Result Date: 10/18/2020 CLINICAL DATA:  Fracture fragment between the bases of the fourth and fifth metacarpals, evaluate for location EXAM: CT OF THE RIGHT WRIST WITHOUT CONTRAST TECHNIQUE: Multidetector CT imaging of the right wrist was performed according to the standard protocol. Multiplanar CT image  reconstructions were also generated. COMPARISON:  Wrist films from earlier in the same day. FINDINGS: Bones/Joint/Cartilage There is an avulsion fracture identified along the lateral aspect of the base of the fifth metacarpal which corresponds to that seen on recent plain film. It extends into the articular surface with the hamate. The remainder of the metacarpals appear within normal limits. Mild degenerative changes at the first [facility] joint are seen. Carpal bones as well as the distal radius and ulna are unremarkable. Ligaments Suboptimally assessed by CT. Muscles and Tendons Musculature appears within normal limits. Soft tissues Mild soft tissue swelling is noted related to the fracture. No sizable hematoma is seen. IMPRESSION: Fracture seen on plain film  arises from the lateral aspect of the base of the fifth metatarsal. No other fractures are seen. Electronically Signed   By: Alcide Clever M.D.   On: 10/18/2020 20:03   DG Hand Complete Right  Result Date: 10/18/2020 CLINICAL DATA:  Fall a week ago, ongoing hand and wrist pain. EXAM: RIGHT HAND - COMPLETE 3+ VIEW COMPARISON:  None. FINDINGS: Displaced fracture fragment interposed between the bases of the fourth and fifth metacarpal bones, of uncertain origin. Osseous structures appear otherwise intact and normally aligned throughout. IMPRESSION: Displaced fracture fragment interposed between the bases of the fourth and fifth metacarpal bones, of uncertain origin, suspect avulsion from the base of the fifth metacarpal bone. Electronically Signed   By: Bary Richard M.D.   On: 10/18/2020 18:52    ____________________________________________    PROCEDURES  Procedure(s) performed:    .Splint Application  Date/Time: 10/18/2020 8:48 PM Performed by: Racheal Patches, PA-C Authorized by: Racheal Patches, PA-C   Consent:    Consent obtained:  Verbal   Consent given by:  Patient   Risks discussed:  Pain and swelling Universal protocol:    Procedure explained and questions answered to patient or proxy's satisfaction: yes     Immediately prior to procedure a time out was called: yes     Patient identity confirmed:  Verbally with patient Pre-procedure details:    Pre-procedure CMS: mild numbness in ulnar nerve distribution, but sensation still maintained. Procedure details:    Location:  Hand   Hand location:  R hand   Splint type:  Ulnar gutter   Supplies:  Cotton padding, fiberglass and elastic bandage Post-procedure details:    Distal neurologic exam:  Unchanged   Distal perfusion: distal pulses strong     Procedure completion:  Tolerated well, no immediate complications    Medications - No data to display   ____________________________________________   INITIAL  IMPRESSION / ASSESSMENT AND PLAN / ED COURSE  Pertinent labs & imaging results that were available during my care of the patient were reviewed by me and considered in my medical decision making (see chart for details).  Review of the [place] CSRS was performed in accordance of the [facility] prior to dispensing any controlled drugs.           Patient's diagnosis is consistent with fifth metacarpal fracture.  Patient presented to the emergency department after sustaining a fall a week ago with a fracture on the base of the fifth metacarpal.  Patient had had ongoing pain but provement of the edema.  On exam patient was tender over the fourth and fifth metacarpals and had a finding on x-ray that was concerning for fracture.  CT scan was obtained as she did have some ulnar nerve numbness and tingling.  CT confirms fracture.  At this time  given the ongoing good range of motion, sensation still intact though somewhat decreased along ulnar nerve distribution feel that patient is stable for follow-up with orthopedics.  Patient will have ulnar gutter splint placed.  Follow-up with Ortho for further management.  Limited pain medication anti-inflammatory prescribed at this time.  Return precautions discussed with the patient.. Patient is given ED precautions to return to the ED for any worsening or new symptoms.     ____________________________________________  FINAL CLINICAL IMPRESSION(S) / ED DIAGNOSES  Final diagnoses:  Closed nondisplaced fracture of base of fifth metacarpal bone of right hand, initial encounter      NEW MEDICATIONS STARTED DURING THIS VISIT:  ED Discharge Orders          Ordered    HYDROcodone-acetaminophen (NORCO/VICODIN) 5-325 MG tablet  Every 4 hours PRN        10/18/20 2039    meloxicam (MOBIC) 15 MG tablet  Daily        10/18/20 2039                This chart was dictated using voice recognition software/Dragon. Despite best efforts to proofread, errors can occur  which can change the meaning. Any change was purely unintentional.    Racheal Patches, PA-C 10/18/20 2049    Chesley Noon, MD 10/19/20 1515

## 2020-10-18 NOTE — ED Triage Notes (Signed)
Pt come with c/o right hand pain.

## 2021-02-01 ENCOUNTER — Emergency Department
Admission: EM | Admit: 2021-02-01 | Discharge: 2021-02-01 | Disposition: A | Discharge status: Home / Self Care | Payer: 59 | Attending: Emergency Medicine | Admitting: Emergency Medicine

## 2021-02-01 ENCOUNTER — Other Ambulatory Visit: Payer: Self-pay

## 2021-02-01 ENCOUNTER — Emergency Department: Payer: 59

## 2021-02-01 DIAGNOSIS — Z20822 Contact with and (suspected) exposure to covid-19: Secondary | ICD-10-CM | POA: Insufficient documentation

## 2021-02-01 DIAGNOSIS — J209 Acute bronchitis, unspecified: Secondary | ICD-10-CM | POA: Diagnosis not present

## 2021-02-01 DIAGNOSIS — J3489 Other specified disorders of nose and nasal sinuses: Secondary | ICD-10-CM | POA: Insufficient documentation

## 2021-02-01 DIAGNOSIS — J40 Bronchitis, not specified as acute or chronic: Secondary | ICD-10-CM

## 2021-02-01 DIAGNOSIS — R059 Cough, unspecified: Secondary | ICD-10-CM | POA: Diagnosis present

## 2021-02-01 LAB — CBC WITH DIFFERENTIAL/PLATELET
Abs Immature Granulocytes: 0.02 10*3/uL (ref 0.00–0.07)
Basophils Absolute: 0 10*3/uL (ref 0.0–0.1)
Basophils Relative: 1 %
Eosinophils Absolute: 0.2 10*3/uL (ref 0.0–0.5)
Eosinophils Relative: 3 %
HCT: 34.7 % — ABNORMAL LOW (ref 36.0–46.0)
Hemoglobin: 11.7 g/dL — ABNORMAL LOW (ref 12.0–15.0)
Immature Granulocytes: 0 %
Lymphocytes Relative: 29 %
Lymphs Abs: 2 10*3/uL (ref 0.7–4.0)
MCH: 27.2 pg (ref 26.0–34.0)
MCHC: 33.7 g/dL (ref 30.0–36.0)
MCV: 80.7 fL (ref 80.0–100.0)
Monocytes Absolute: 0.6 10*3/uL (ref 0.1–1.0)
Monocytes Relative: 8 %
Neutro Abs: 4 10*3/uL (ref 1.7–7.7)
Neutrophils Relative %: 59 %
Platelets: 315 10*3/uL (ref 150–400)
RBC: 4.3 MIL/uL (ref 3.87–5.11)
RDW: 14.5 % (ref 11.5–15.5)
WBC: 6.8 10*3/uL (ref 4.0–10.5)
nRBC: 0 % (ref 0.0–0.2)

## 2021-02-01 LAB — BASIC METABOLIC PANEL
Anion gap: 7 (ref 5–15)
BUN: 12 mg/dL (ref 6–20)
CO2: 23 mmol/L (ref 22–32)
Calcium: 8.7 mg/dL — ABNORMAL LOW (ref 8.9–10.3)
Chloride: 105 mmol/L (ref 98–111)
Creatinine, Ser: 0.86 mg/dL (ref 0.44–1.00)
GFR, Estimated: >60 mL/min (ref >60)
Glucose, Bld: 121 mg/dL — ABNORMAL HIGH (ref 70–99)
Potassium: 4.1 mmol/L (ref 3.5–5.1)
Sodium: 135 mmol/L (ref 135–145)

## 2021-02-01 LAB — BRAIN NATRIURETIC PEPTIDE: B Natriuretic Peptide: 5.2 pg/mL (ref 0.0–100.0)

## 2021-02-01 LAB — TROPONIN I (HIGH SENSITIVITY)
Troponin I (High Sensitivity): 4 ng/L (ref <18)
Troponin I (High Sensitivity): 4 ng/L (ref <18)

## 2021-02-01 LAB — RESP PANEL BY RT-PCR (FLU A&B, COVID) ARPGX2
Influenza A by PCR: NEGATIVE
Influenza B by PCR: NEGATIVE
SARS Coronavirus 2 by RT PCR: NEGATIVE

## 2021-02-01 IMAGING — CR DG CHEST 2V
1 series · 2 of 2 positions shown · non-contrast
Comparison: [DATE]

CLINICAL DATA: Cough and short of breath

EXAM:
CHEST - 2 VIEW

[Series 1: dg chest 2 view · 0.14mm/px · 2 of 2 slices shown]
[im 1/2]
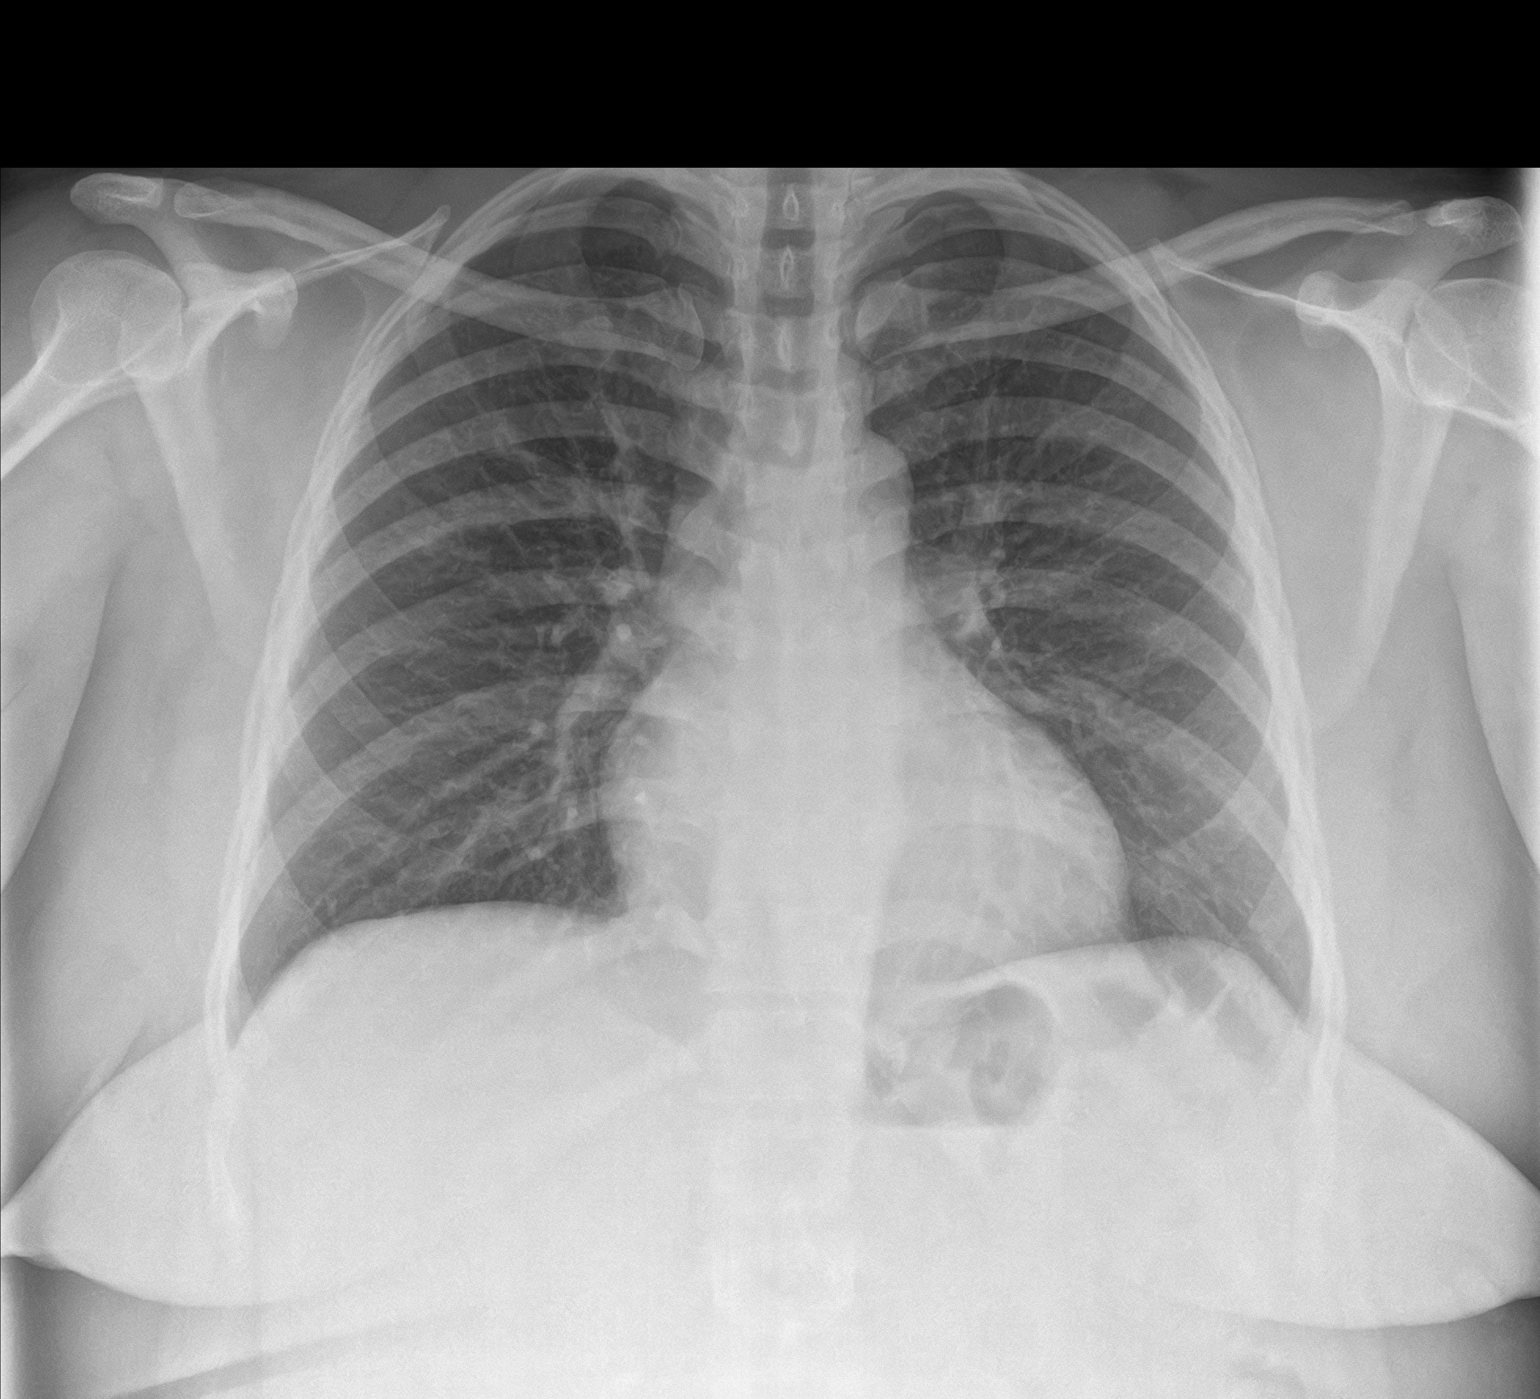
[im 2/2]
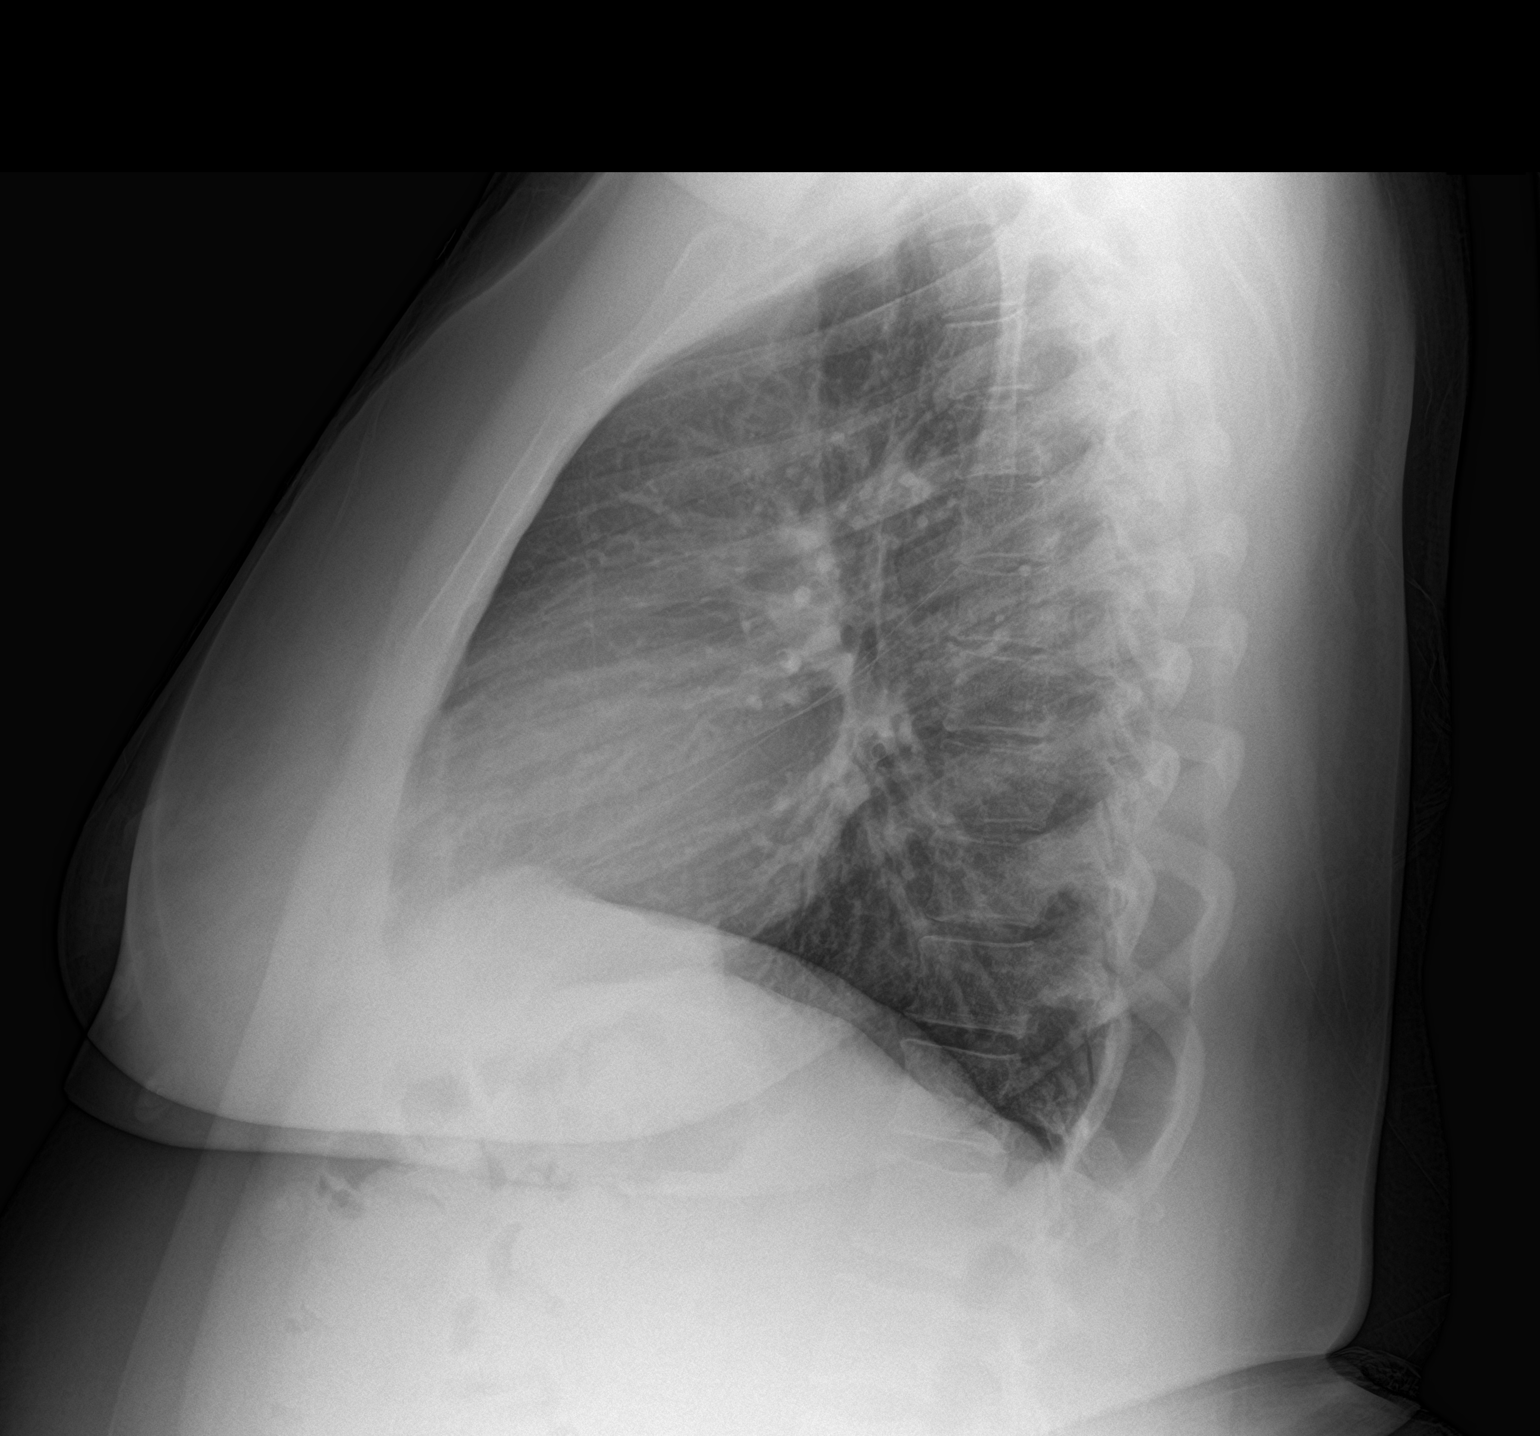

[2 of 2 positions shown; findings below may reference images not displayed]

FINDINGS: The heart size and mediastinal contours are within normal limits.
Both lungs are clear. The visualized skeletal structures are
unremarkable.
IMPRESSION: No active cardiopulmonary disease.

## 2021-02-01 MED ORDER — AZITHROMYCIN 250 MG PO TABS: ORAL_TABLET | ORAL | 0 refills | Status: AC

## 2021-02-01 MED ORDER — PREDNISONE 10 MG (21) PO TBPK: ORAL_TABLET | ORAL | 0 refills | Status: AC

## 2021-02-01 MED ORDER — BENZONATATE 100 MG PO CAPS: 100.0000 mg | ORAL_CAPSULE | Freq: Three times a day (TID) | ORAL | 0 refills | Status: AC | PRN

## 2021-02-01 NOTE — ED Provider Notes (Signed)
[facility]-EMERGENCY DEPARTMENT  ____________________________________________  Time seen: Approximately 10:49 PM  I have reviewed the triage vital signs and the nursing notes.   HISTORY  Chief Complaint URI   Historian Patient     HPI Leah Dodson is a 45 y.o. female presents to the emergency department with cough for the past 10 days, nasal congestion and rhinorrhea.  Patient denies chest pain or chest tightness.  She denies vomiting or diarrhea at home.  No sick contacts in the home with similar symptoms.   Past Medical History:  Diagnosis Date   Disturbance of sleep    GERD (gastroesophageal reflux disease)    History of hiatal hernia    Preeclampsia      Immunizations up to date:  Yes.     Past Medical History:  Diagnosis Date   Disturbance of sleep    GERD (gastroesophageal reflux disease)    History of hiatal hernia    Preeclampsia     There are no problems to display for this patient.   Past Surgical History:  Procedure Laterality Date   CHOLECYSTECTOMY     COLONOSCOPY WITH PROPOFOL N/A 03/13/2015   Procedure: COLONOSCOPY WITH PROPOFOL;  Surgeon: Wallace Cullens, MD;  Location: [facility] ENDOSCOPY;  Service: Gastroenterology;  Laterality: N/A;   csections     ESOPHAGOGASTRODUODENOSCOPY (EGD) WITH PROPOFOL N/A 03/13/2015   Procedure: ESOPHAGOGASTRODUODENOSCOPY (EGD) WITH PROPOFOL;  Surgeon: Wallace Cullens, MD;  Location: [facility] ENDOSCOPY;  Service: Gastroenterology;  Laterality: N/A;    Prior to Admission medications   Medication Sig Start Date End Date Taking? Authorizing Provider  azithromycin (ZITHROMAX Z-PAK) 250 MG tablet Take 2 tablets (500 mg) on  Day 1,  followed by 1 tablet (250 mg) once daily on Days 2 through 5. 02/01/21 02/06/21 Yes Pia Mau M, PA-C  predniSONE (STERAPRED UNI-PAK 21 TAB) 10 MG (21) TBPK tablet Take 6 tablets the first day, take 5 tablets the second day, take 4 tablets the third day, take 3 tablets the fourth day, take 2 tablets the fifth  day, take 1 tablet the sixth day. 02/01/21  Yes Pia Mau M, PA-C  HYDROcodone-acetaminophen (NORCO/VICODIN) 5-325 MG tablet Take 1 tablet by mouth every 4 (four) hours as needed for moderate pain. 10/18/20 10/18/21  Cuthriell, Delorise Royals, PA-C  Hyoscyamine Sulfate (HYOSCYAMINE PO) Take 1 tablet by mouth as needed.    [provider]  meloxicam (MOBIC) 15 MG tablet Take 1 tablet (15 mg total) by mouth daily. 10/18/20   Cuthriell, Delorise Royals, PA-C  omeprazole (PRILOSEC) 40 MG capsule Take 40 mg by mouth daily.    [provider]    Allergies Patient has no known allergies.  No family history on file.  Social History Social History   Tobacco Use   Smoking status: Never  Substance Use Topics   Alcohol use: Not Currently      Review of Systems  Constitutional: Patient has fever.  Eyes: No visual changes. No discharge ENT: Patient has congestion.  Cardiovascular: no chest pain. Respiratory: Patient has cough.  Gastrointestinal: No abdominal pain.  No nausea, no vomiting. Patient had diarrhea.  Genitourinary: Negative for dysuria. No hematuria Musculoskeletal: Patient has myalgias.  Skin: Negative for rash, abrasions, lacerations, ecchymosis. Neurological: Patient has headache, no focal weakness or numbness.    ____________________________________________   PHYSICAL EXAM:  VITAL SIGNS: ED Triage Vitals  Enc Vitals Group     BP 02/01/21 1738 (!) 142/86     Pulse Rate 02/01/21 1738 91  Resp 02/01/21 1738 20     Temp 02/01/21 1738 98.1 F (36.7 C)     Temp Source 02/01/21 1738 Oral     SpO2 02/01/21 1738 97 %     Weight --      Height --      Head Circumference --      Peak Flow --      Pain Score 02/01/21 1806 4     Pain Loc --      Pain Edu? --      Excl. in [place]? --      Constitutional: Alert and oriented. Patient is lying supine. Eyes: Conjunctivae are normal. PERRL. EOMI. Head: Atraumatic. ENT:      Ears: Tympanic membranes are mildly  injected with mild effusion bilaterally.       Nose: No congestion/rhinnorhea.      Mouth/Throat: Mucous membranes are moist. Posterior pharynx is mildly erythematous.  Hematological/Lymphatic/Immunilogical: No cervical lymphadenopathy.  Cardiovascular: Normal rate, regular rhythm. Normal S1 and S2.  Good peripheral circulation. Respiratory: Normal respiratory effort without tachypnea or retractions. Lungs CTAB. Good air entry to the bases with no decreased or absent breath sounds. Gastrointestinal: Bowel sounds 4 quadrants. Soft and nontender to palpation. No guarding or rigidity. No palpable masses. No distention. No CVA tenderness. Musculoskeletal: Full range of motion to all extremities. No gross deformities appreciated. Neurologic:  Normal speech and language. No gross focal neurologic deficits are appreciated.  Skin:  Skin is warm, dry and intact. No rash noted. Psychiatric: Mood and affect are normal. Speech and behavior are normal. Patient exhibits appropriate insight and judgement.   ____________________________________________   LABS (all labs ordered are listed, but only abnormal results are displayed)  Labs Reviewed  CBC WITH DIFFERENTIAL/PLATELET - Abnormal; Notable for the following components:      Result Value   Hemoglobin 11.7 (*)    HCT 34.7 (*)    All other components within normal limits  BASIC METABOLIC PANEL - Abnormal; Notable for the following components:   Glucose, Bld 121 (*)    Calcium 8.7 (*)    All other components within normal limits  RESP PANEL BY RT-PCR (FLU A&B, COVID) ARPGX2  BRAIN NATRIURETIC PEPTIDE  TROPONIN I (HIGH SENSITIVITY)  TROPONIN I (HIGH SENSITIVITY)   ____________________________________________  EKG   ____________________________________________  RADIOLOGY Unk Pinto, personally viewed and evaluated these images (plain radiographs) as part of my medical decision making, as well as reviewing the written report by the  radiologist.  DG Chest 2 View  Result Date: 02/01/2021 CLINICAL DATA:  Cough and short of breath EXAM: CHEST - 2 VIEW COMPARISON:  05/19/2005 FINDINGS: The heart size and mediastinal contours are within normal limits. Both lungs are clear. The visualized skeletal structures are unremarkable. IMPRESSION: No active cardiopulmonary disease. Electronically Signed   By: Donavan Foil M.D.   On: 02/01/2021 18:16    ____________________________________________    PROCEDURES  Procedure(s) performed:     Procedures     Medications - No data to display   ____________________________________________   INITIAL IMPRESSION / ASSESSMENT AND PLAN / ED COURSE  Pertinent labs & imaging results that were available during my care of the patient were reviewed by me and considered in my medical decision making (see chart for details).  Clinical Course as of 02/01/21 2249  Mon Feb 01, 2021  2212 MCV: 80.7 [JW]    Clinical Course User Index [JW] Lannie Fields, PA-C      Assessment and  plan Bronchitis 45 year old female presents to the emergency department with cough for the past 10 days.  Vital signs are reassuring at triage.  On physical exam, patient was alert, active and nontoxic-appearing with no increased work of breathing.  There were no consolidations, opacities or infiltrates on chest x-ray.  We will treat with azithromycin and prednisone and have patient follow-up with primary care as needed.    ____________________________________________  FINAL CLINICAL IMPRESSION(S) / ED DIAGNOSES  Final diagnoses:  Bronchitis      NEW MEDICATIONS STARTED DURING THIS VISIT:  ED Discharge Orders          Ordered    predniSONE (STERAPRED UNI-PAK 21 TAB) 10 MG (21) TBPK tablet        02/01/21 2220    azithromycin (ZITHROMAX Z-PAK) 250 MG tablet        02/01/21 2220                This chart was dictated using voice recognition software/Dragon. Despite best efforts to  proofread, errors can occur which can change the meaning. Any change was purely unintentional.     Lannie Fields, PA-C 02/01/21 2251    Vladimir Crofts, MD 02/02/21 1124

## 2021-02-01 NOTE — Discharge Instructions (Signed)
Take tapered steroid as directed. Take Azithromycin as directed.  Feel better soon.

## 2021-02-01 NOTE — ED Triage Notes (Signed)
Pt c/o cough congestion, sweats, chills, SOB, chest pain for the past 10 days, pt is in NAD at present.

## 2021-02-01 NOTE — ED Provider Notes (Signed)
Emergency Medicine Provider Triage Evaluation Note  TANAIYA KOLARIK , a 45 y.o. female  was evaluated in triage.  Pt complains of presents to the ED with cough, presents with chest pain, and associated shortness of breath.  Patient was symptoms for several days denies any frank fevers, chills, or sweats.  Review of Systems  Positive: Cough, SOB, CP Negative: NVD  Physical Exam  BP (!) 142/86 (BP Location: Left Arm)   Pulse 91   Temp 98.1 F (36.7 C) (Oral)   Resp 20   SpO2 97%  Gen:   Awake, no distress  NAD Resp:  Normal effort mild rhonchi noted MSK:   Moves extremities without difficulty  Other:  CVS: RRR  Medical Decision Making  Medically screening exam initiated at 5:43 PM.  Appropriate orders placed.  ISIDORA LAHAM was informed that the remainder of the evaluation will be completed by another provider, this initial triage assessment does not replace that evaluation, and the importance of remaining in the ED until their evaluation is complete.  Patient ED evaluation of cough and congestion with associated shortness of breath.   Lissa Hoard, PA-C 02/01/21 1756    Delton Prairie, MD 02/01/21 [phone]

## 2021-02-01 NOTE — ED Notes (Signed)
Pt ambulated self efficiently to restroom. Pt o2 sat @ 99%. Pt returned safely to bedside.

## 2021-02-01 NOTE — ED Notes (Signed)
Reviewed discharge instructions, follow-up care, and prescriptions with patient. Patient verbalized understanding of all information reviewed. Patient stable, with no distress noted at this time.    

## 2021-04-04 DIAGNOSIS — R42 Dizziness and giddiness: Secondary | ICD-10-CM | POA: Insufficient documentation

## 2021-04-04 DIAGNOSIS — Y93E5 Activity, floor mopping and cleaning: Secondary | ICD-10-CM | POA: Diagnosis not present

## 2021-04-04 DIAGNOSIS — R112 Nausea with vomiting, unspecified: Secondary | ICD-10-CM | POA: Diagnosis not present

## 2021-04-04 DIAGNOSIS — W01198A Fall on same level from slipping, tripping and stumbling with subsequent striking against other object, initial encounter: Secondary | ICD-10-CM | POA: Diagnosis not present

## 2021-04-04 LAB — CBC
HCT: 34.1 % — ABNORMAL LOW (ref 36.0–46.0)
Hemoglobin: 11.6 g/dL — ABNORMAL LOW (ref 12.0–15.0)
MCH: 27 pg (ref 26.0–34.0)
MCHC: 34 g/dL (ref 30.0–36.0)
MCV: 79.5 fL — ABNORMAL LOW (ref 80.0–100.0)
Platelets: 350 10*3/uL (ref 150–400)
RBC: 4.29 MIL/uL (ref 3.87–5.11)
RDW: 15.5 % (ref 11.5–15.5)
WBC: 10.7 10*3/uL — ABNORMAL HIGH (ref 4.0–10.5)
nRBC: 0 % (ref 0.0–0.2)

## 2021-04-04 LAB — COMPREHENSIVE METABOLIC PANEL
ALT: 20 U/L (ref 0–44)
AST: 22 U/L (ref 15–41)
Albumin: 4.1 g/dL (ref 3.5–5.0)
Alkaline Phosphatase: 72 U/L (ref 38–126)
Anion gap: 9 (ref 5–15)
BUN: 12 mg/dL (ref 6–20)
CO2: 26 mmol/L (ref 22–32)
Calcium: 9.6 mg/dL (ref 8.9–10.3)
Chloride: 101 mmol/L (ref 98–111)
Creatinine, Ser: 0.86 mg/dL (ref 0.44–1.00)
GFR, Estimated: >60 mL/min (ref >60)
Glucose, Bld: 157 mg/dL — ABNORMAL HIGH (ref 70–99)
Potassium: 3.6 mmol/L (ref 3.5–5.1)
Sodium: 136 mmol/L (ref 135–145)
Total Bilirubin: 0.4 mg/dL (ref 0.3–1.2)
Total Protein: 7.6 g/dL (ref 6.5–8.1)

## 2021-04-04 LAB — LIPASE, BLOOD: Lipase: 30 U/L (ref 11–51)

## 2021-04-04 MED ORDER — ONDANSETRON 4 MG PO TBDP
4.0000 mg | ORAL_TABLET | Freq: Once | ORAL | Status: AC | PRN
  Administered 2021-04-04: 4 mg via ORAL
  Filled 2021-04-04: qty 1

## 2021-04-04 NOTE — ED Triage Notes (Signed)
FIRST NURSE NOTE:  Pt c/o dizziness and vomiting tonight, pt states she cannot open her eyes with out the room spinning.

## 2021-04-04 NOTE — ED Triage Notes (Signed)
Pt coming from home for dizziness that started today. She then became nauseated and now reports she had some palpitations. She reports some neck pain from having to hold her head in a certain position at this time.

## 2021-04-05 ENCOUNTER — Emergency Department
Admission: EM | Admit: 2021-04-05 | Discharge: 2021-04-05 | Disposition: A | Discharge status: Home / Self Care | Payer: 59 | Attending: Emergency Medicine | Admitting: Emergency Medicine

## 2021-04-05 ENCOUNTER — Emergency Department: Payer: 59

## 2021-04-05 DIAGNOSIS — R42 Dizziness and giddiness: Secondary | ICD-10-CM

## 2021-04-05 LAB — URINALYSIS, ROUTINE W REFLEX MICROSCOPIC
Bilirubin Urine: NEGATIVE
Glucose, UA: NEGATIVE mg/dL
Ketones, ur: NEGATIVE mg/dL
Leukocytes,Ua: NEGATIVE
Nitrite: NEGATIVE
Protein, ur: NEGATIVE mg/dL
Specific Gravity, Urine: 1.02 (ref 1.005–1.030)
Squamous Epithelial / HPF: >50 — ABNORMAL HIGH (ref 0–5)
pH: 5 (ref 5.0–8.0)

## 2021-04-05 LAB — POC URINE PREG, ED: Preg Test, Ur: NEGATIVE

## 2021-04-05 IMAGING — MR MR MRA HEAD W/O CM
1 series · 19 of 48 positions shown · IV contrast (gadavist)
Comparison: No pertinent prior exam.

CLINICAL DATA: Peripheral vertigo.

EXAM:
MRI HEAD WITHOUT CONTRAST
MRA HEAD WITHOUT CONTRAST
MRA OF THE NECK WITHOUT AND WITH CONTRAST
TECHNIQUE: Multiplanar, multi-echo pulse sequences of the brain and surrounding
structures were acquired without intravenous contrast. Angiographic
images of the Circle of Willis were acquired using MRA technique
without intravenous contrast. Angiographic images of the neck were
acquired using MRA technique without and with intravenous contrast.
Carotid stenosis measurements (when applicable) are obtained
utilizing NASCET criteria, using the distal internal carotid
diameter as the denominator.
CONTRAST:  10mL GADAVIST GADOBUTROL 1 MMOL/ML IV SOLN

[Series 1: TOF · axial · 0.5mm · 0.41mm/px · z∈[-120,-23]mm · 19 of 205 slices shown]
[im 1/205]
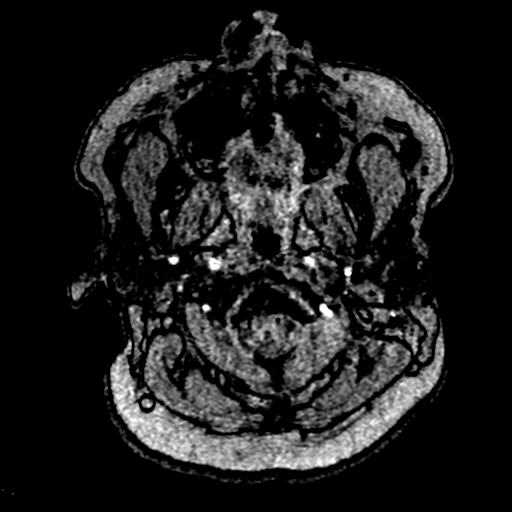
[im 5/205]
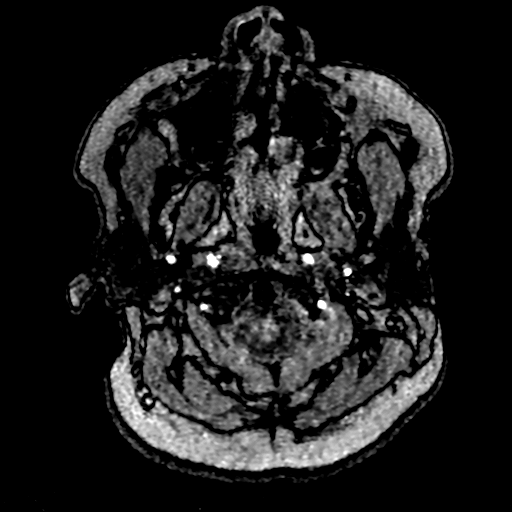
[im 9/205]
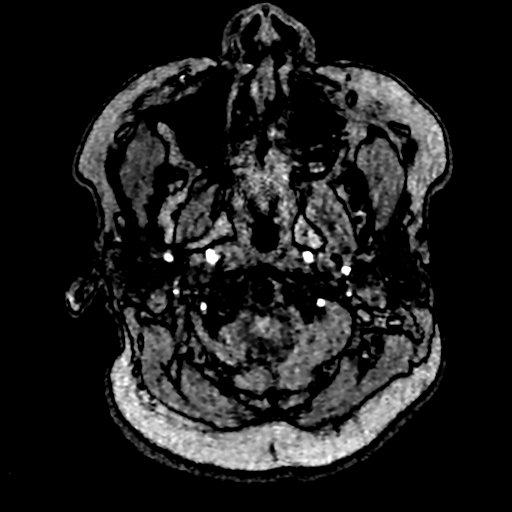
[im 14/205]
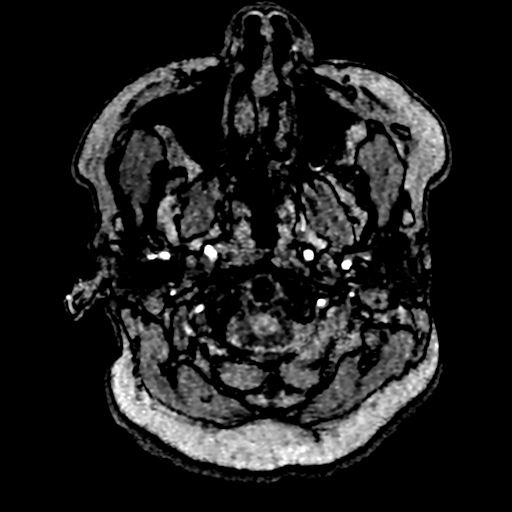
[im 18/205]
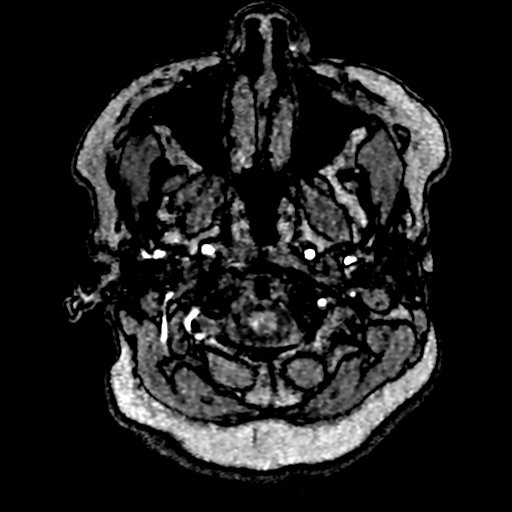
[im 22/205]
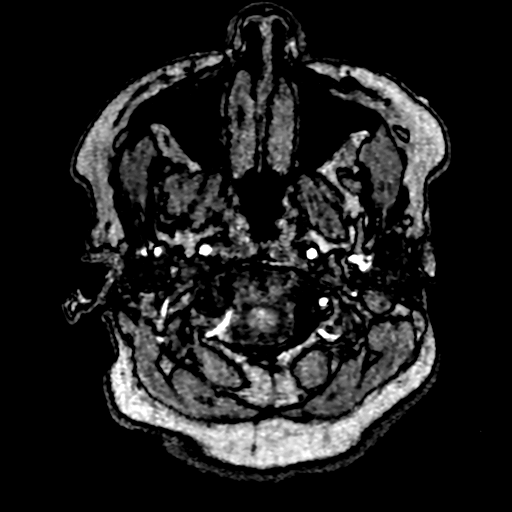
[im 27/205]
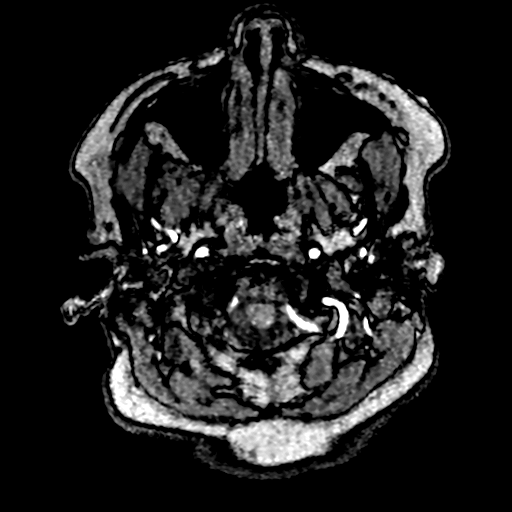
[im 31/205]
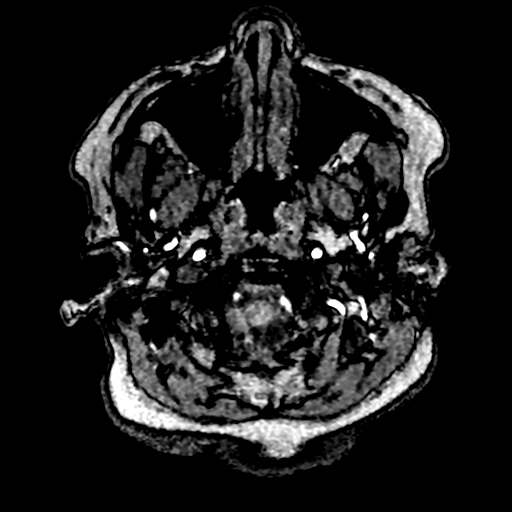
[im 35/205]
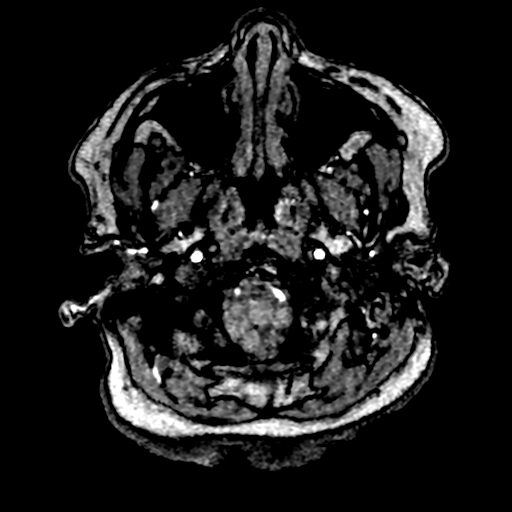
[im 40/205]
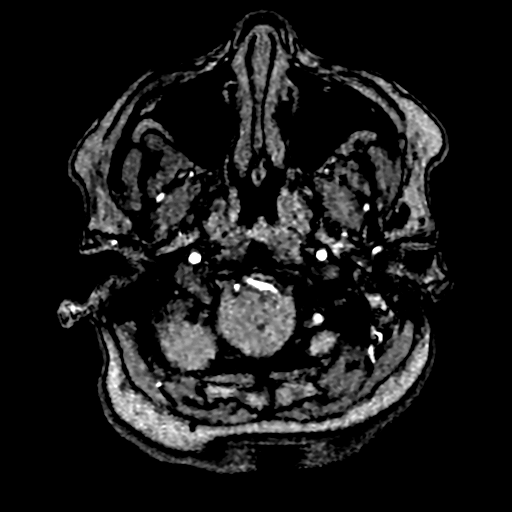
[im 44/205]
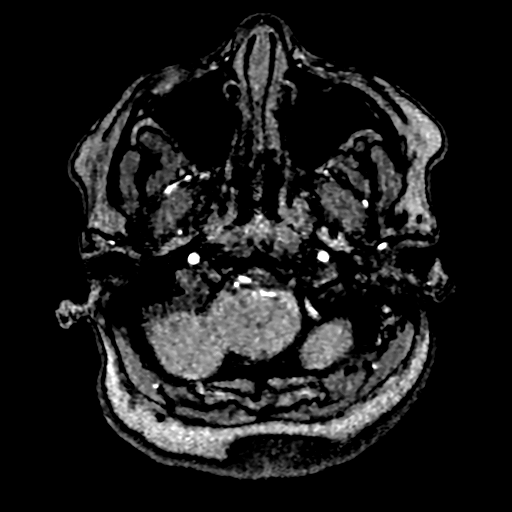
[im 66/205]
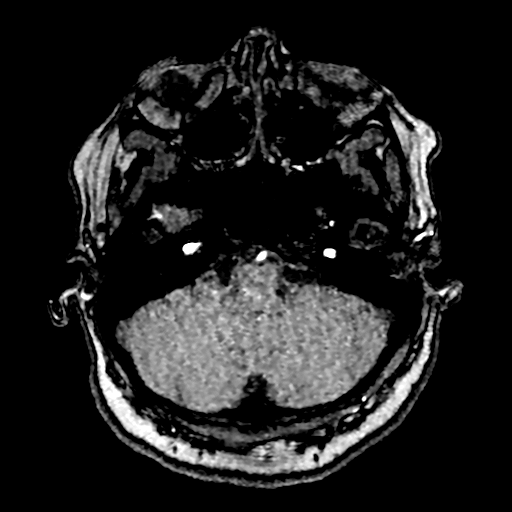
[im 92/205]
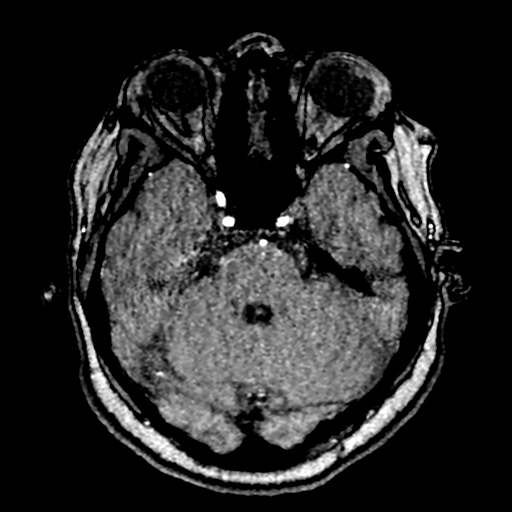
[im 105/205]
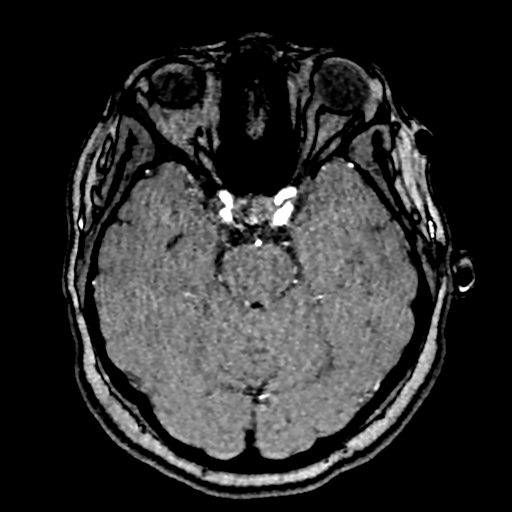
[im 118/205]
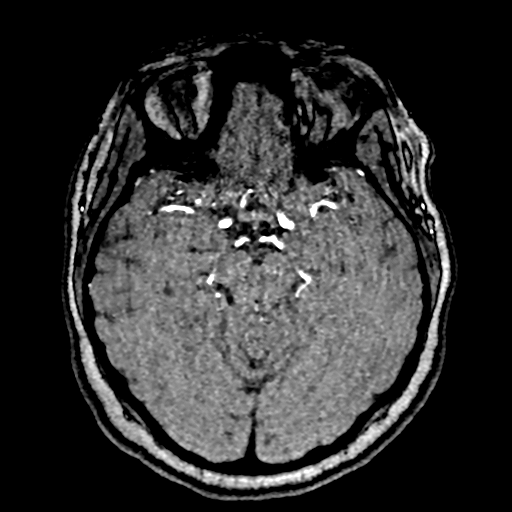
[im 144/205]
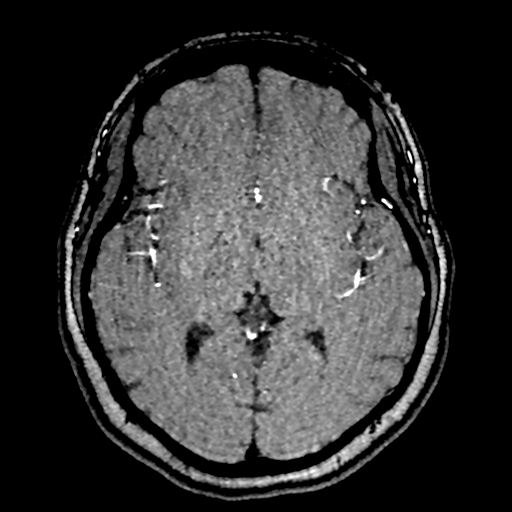
[im 170/205]
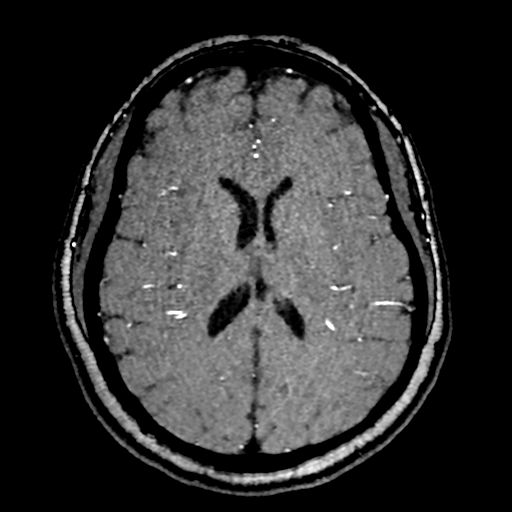
[im 174/205]
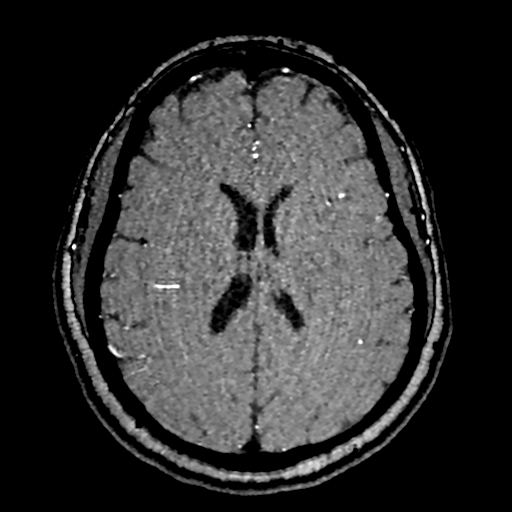
[im 196/205]
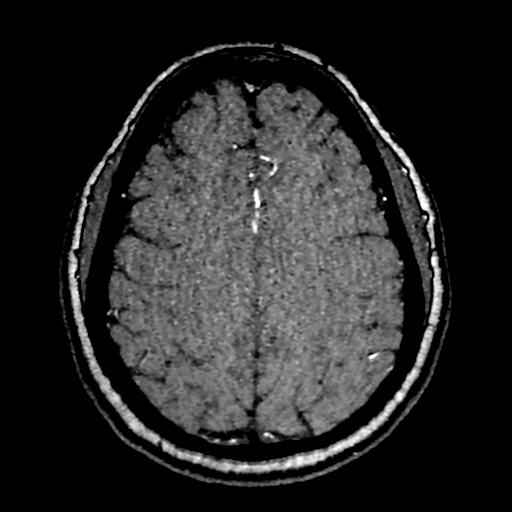

[19 of 48 positions shown; findings below may reference images not displayed]

FINDINGS: MR HEAD FINDINGS

Brain: No acute infarction, hemorrhage, hydrocephalus, extra-axial
collection or mass lesion. No white matter disease or atrophy

Vascular: See below.  Unremarkable flow voids

Skull and upper cervical spine: Normal marrow signal

Sinuses/Orbits: Gaze to the left. No visible orbit or sinus
inflammation. Clear mastoid and middle ear spaces.

Other: Area of hypointense susceptibility artifact along the left
temporal skin from calcification or foreign body.

MRA HEAD FINDINGS

Anterior circulation: Vessels are smooth and widely patent. No
branch occlusion, beading, or aneurysm

Posterior circulation: Vessels are smooth and widely patent. No
branch occlusion, beading, or aneurysm.

Generalized mild motion artifact.

MRA NECK FINDINGS

Aortic arch: Unremarkable.  Three vessel branching.

Right carotid system: No stenosis, beading, or aneurysm seen.
Carotid vessels are widely patent.

Left carotid system: Vessels are smoothly contoured and widely
patent.

Vertebral arteries: No proximal subclavian stenosis. The vertebral
arteries are smoothly contoured and widely patent on source images.

Other: Postcontrast MRA is degraded by venous contamination
IMPRESSION: Brain MRI:

Normal.

Intracranial and neck MRA:

Widely patent vessels.  No explanation for symptoms.

## 2021-04-05 IMAGING — MR MR HEAD W/O CM
12 series · 44 of 48 positions shown · IV contrast (gadavist)
Comparison: No pertinent prior exam.

CLINICAL DATA: Peripheral vertigo.

EXAM:
MRI HEAD WITHOUT CONTRAST
MRA HEAD WITHOUT CONTRAST
MRA OF THE NECK WITHOUT AND WITH CONTRAST
TECHNIQUE: Multiplanar, multi-echo pulse sequences of the brain and surrounding
structures were acquired without intravenous contrast. Angiographic
images of the Circle of Willis were acquired using MRA technique
without intravenous contrast. Angiographic images of the neck were
acquired using MRA technique without and with intravenous contrast.
Carotid stenosis measurements (when applicable) are obtained
utilizing NASCET criteria, using the distal internal carotid
diameter as the denominator.
CONTRAST:  10mL GADAVIST GADOBUTROL 1 MMOL/ML IV SOLN

[Series 5: ax dwi_tracew · axial · 3.0mm · 0.65mm/px · z∈[-109,+39]mm · 5 of 46 slices shown]
[im 1/46]
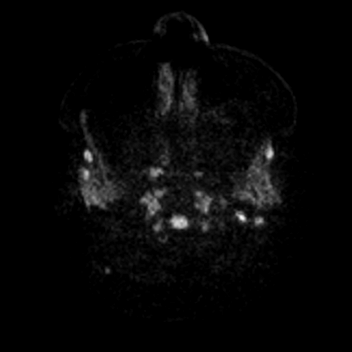
[im 12/46]
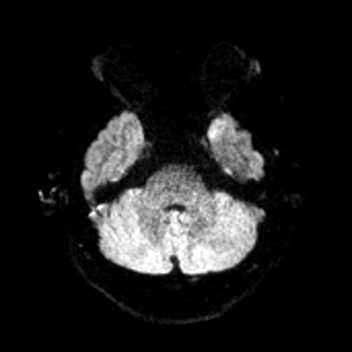
[im 23/46]
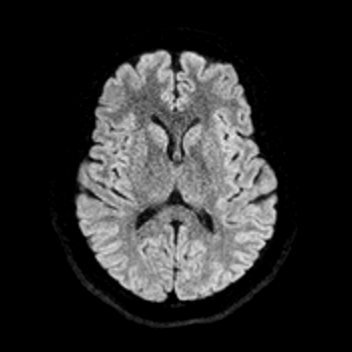
[im 34/46]
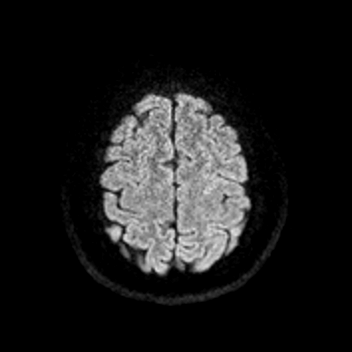
[im 46/46]
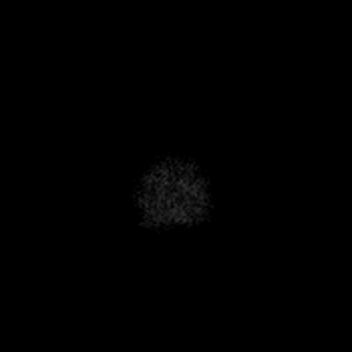

[Series 6: ax dwi_adc · axial · 3.0mm · 0.65mm/px · z∈[-109,+39]mm · 4 of 46 slices shown]
[im 1/46]
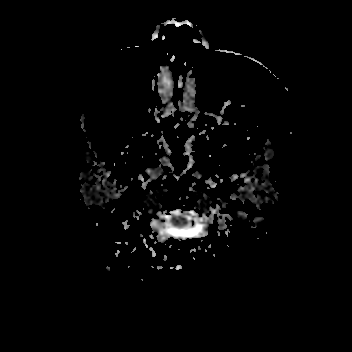
[im 16/46]
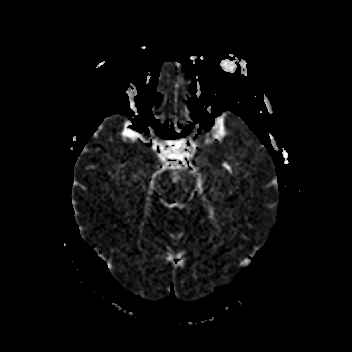
[im 31/46]
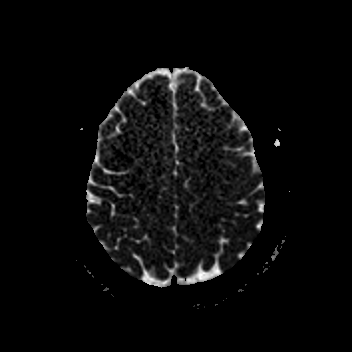
[im 46/46]
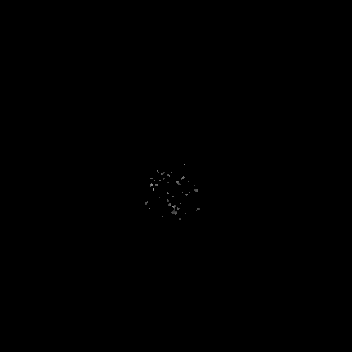

[Series 7: cor dwi_tracew · coronal · 5.0mm · 0.60mm/px · 3 of 36 slices shown]
[im 1/36]
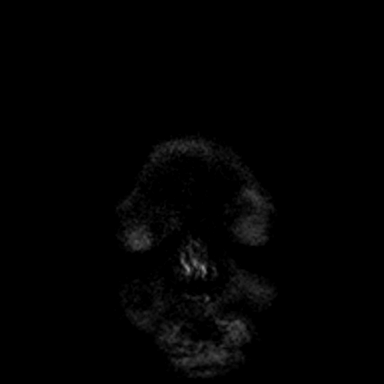
[im 18/36]
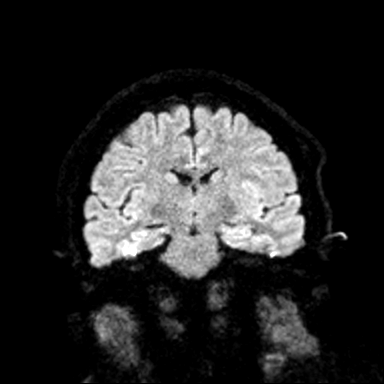
[im 36/36]
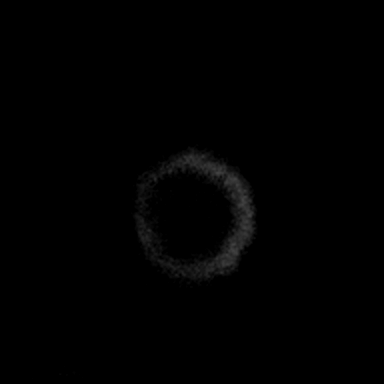

[Series 8: cor dwi_adc · coronal · 5.0mm · 0.60mm/px · 3 of 36 slices shown]
[im 1/36]
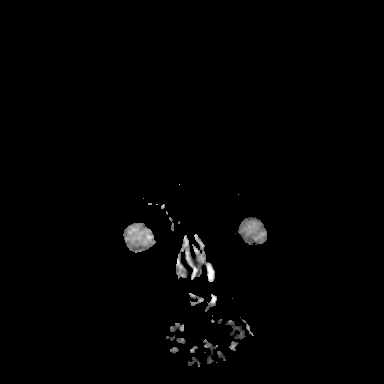
[im 18/36]
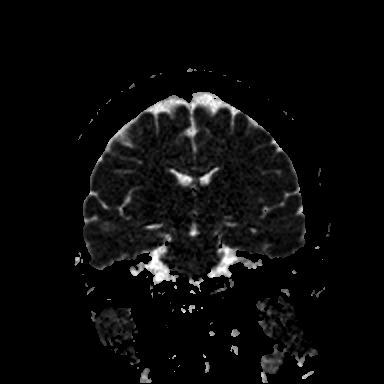
[im 36/36]
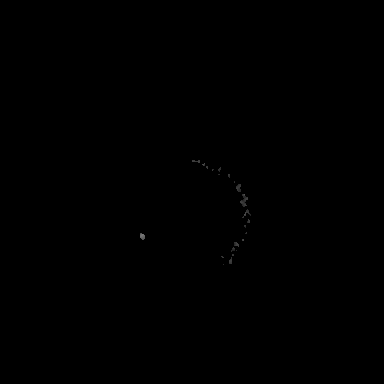

[Series 9: T1 · sagittal · 5.0mm · 0.62mm/px · 2 of 22 slices shown (1 of 2)]
[im 1/22]
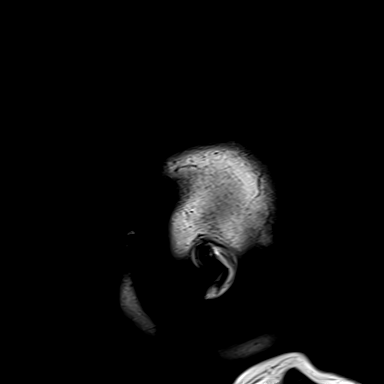
[im 22/22]
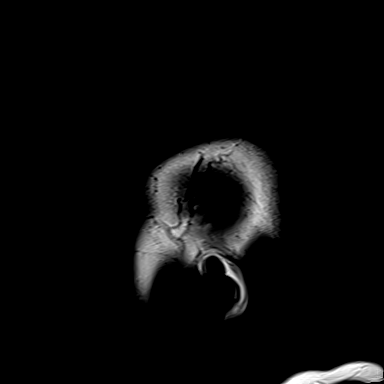

[Series 10: T2 · axial · 5.0mm · 0.53mm/px · z∈[-105,+33]mm · 2 of 24 slices shown (1 of 2)]
[im 1/24]
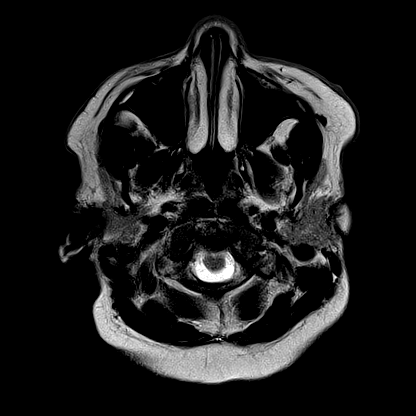
[im 24/24]
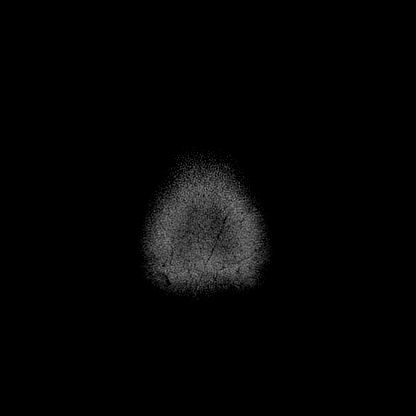

[Series 11: mag_images · axial · 3.0mm · 0.90mm/px · z∈[-113,+40]mm · 4 of 52 slices shown]
[im 1/52]
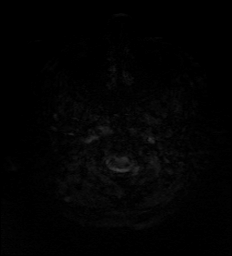
[im 18/52]
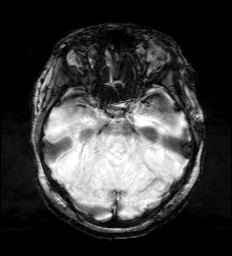
[im 35/52]
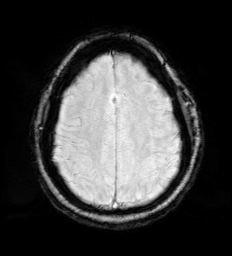
[im 52/52]
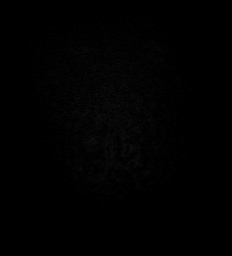

[Series 12: pha_images · axial · 3.0mm · 0.90mm/px · z∈[-113,+37]mm · 4 of 51 slices shown]
[im 1/51]
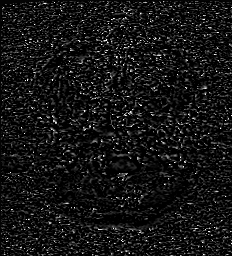
[im 17/51]
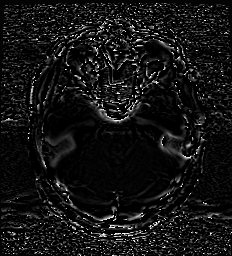
[im 34/51]
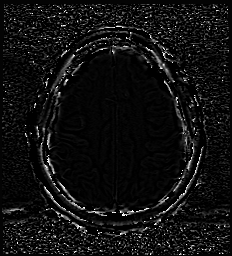
[im 51/51]
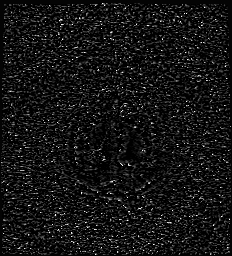

[Series 13: swi_images · axial · 3.0mm · 0.90mm/px · z∈[-113,+40]mm · 4 of 52 slices shown]
[im 1/52]
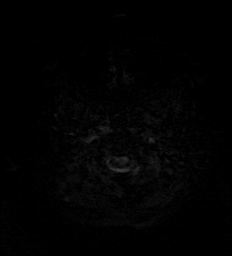
[im 18/52]
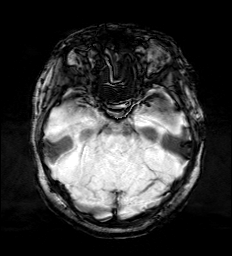
[im 35/52]
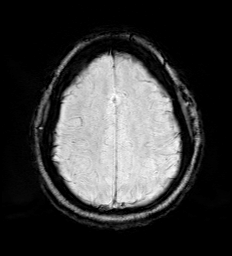
[im 52/52]
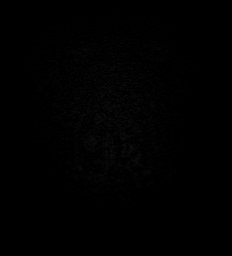

[Series 15: FLAIR · axial · 3.0mm · 0.53mm/px · z∈[-110,+37]mm · 3 of 42 slices shown]
[im 1/42]
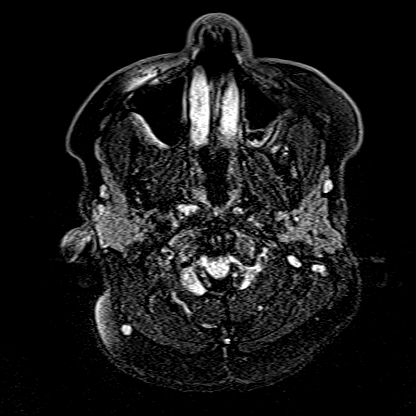
[im 21/42]
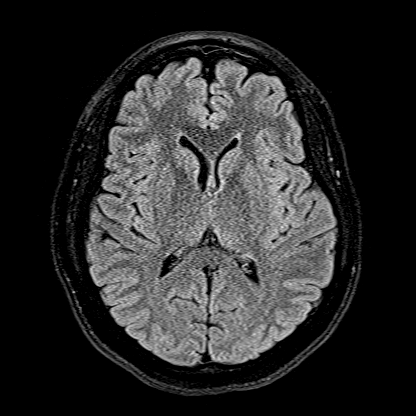
[im 42/42]
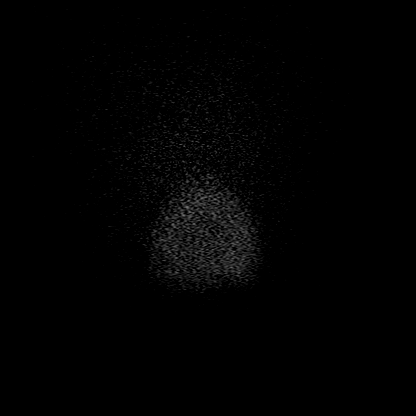

[Series 16: T1 · axial · 1.0mm · 0.98mm/px · z∈[-108,+35]mm · 8 of 144 slices shown (2 of 2)]
[im 1/144]
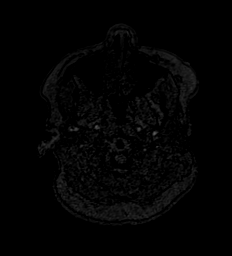
[im 27/144]
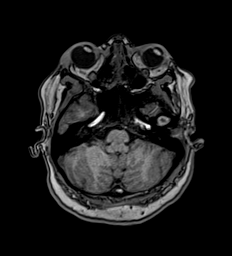
[im 40/144]
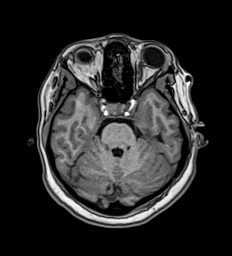
[im 66/144]
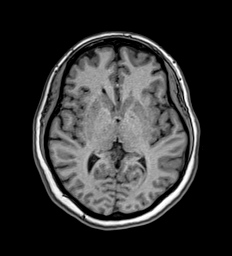
[im 79/144]
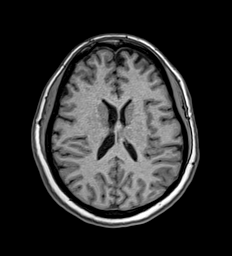
[im 105/144]
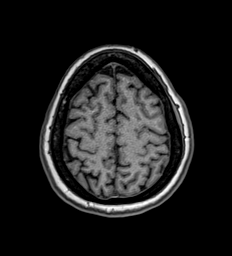
[im 118/144]
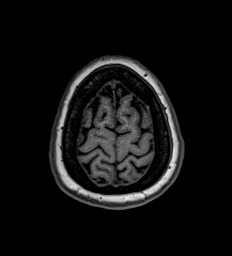
[im 144/144]
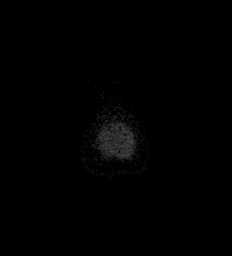

[Series 17: T2 · coronal · 5.0mm · 0.57mm/px · 2 of 26 slices shown (2 of 2)]
[im 1/26]
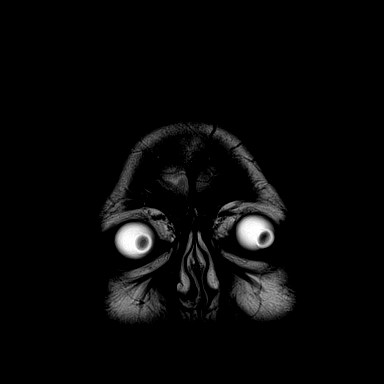
[im 26/26]
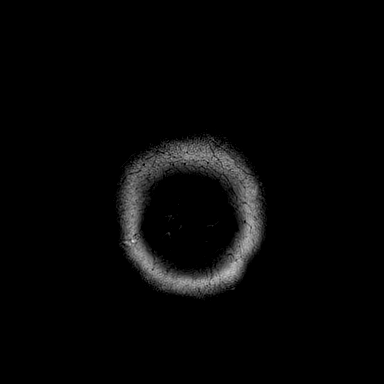

[44 of 48 positions shown; findings below may reference images not displayed]

FINDINGS: MR HEAD FINDINGS

Brain: No acute infarction, hemorrhage, hydrocephalus, extra-axial
collection or mass lesion. No white matter disease or atrophy

Vascular: See below.  Unremarkable flow voids

Skull and upper cervical spine: Normal marrow signal

Sinuses/Orbits: Gaze to the left. No visible orbit or sinus
inflammation. Clear mastoid and middle ear spaces.

Other: Area of hypointense susceptibility artifact along the left
temporal skin from calcification or foreign body.

MRA HEAD FINDINGS

Anterior circulation: Vessels are smooth and widely patent. No
branch occlusion, beading, or aneurysm

Posterior circulation: Vessels are smooth and widely patent. No
branch occlusion, beading, or aneurysm.

Generalized mild motion artifact.

MRA NECK FINDINGS

Aortic arch: Unremarkable.  Three vessel branching.

Right carotid system: No stenosis, beading, or aneurysm seen.
Carotid vessels are widely patent.

Left carotid system: Vessels are smoothly contoured and widely
patent.

Vertebral arteries: No proximal subclavian stenosis. The vertebral
arteries are smoothly contoured and widely patent on source images.

Other: Postcontrast MRA is degraded by venous contamination
IMPRESSION: Brain MRI:

Normal.

Intracranial and neck MRA:

Widely patent vessels.  No explanation for symptoms.

## 2021-04-05 IMAGING — MR MR MRA NECK WO/W CM
1 of 2 series · 9 of 48 positions shown · IV contrast (10ml Gadavist)
Comparison: No pertinent prior exam.

CLINICAL DATA: Peripheral vertigo.

EXAM:
MRI HEAD WITHOUT CONTRAST
MRA HEAD WITHOUT CONTRAST
MRA OF THE NECK WITHOUT AND WITH CONTRAST
TECHNIQUE: Multiplanar, multi-echo pulse sequences of the brain and surrounding
structures were acquired without intravenous contrast. Angiographic
images of the Circle of Willis were acquired using MRA technique
without intravenous contrast. Angiographic images of the neck were
acquired using MRA technique without and with intravenous contrast.
Carotid stenosis measurements (when applicable) are obtained
utilizing NASCET criteria, using the distal internal carotid
diameter as the denominator.
CONTRAST:  10mL GADAVIST GADOBUTROL 1 MMOL/ML IV SOLN

[Series 14: angio_fl3d_cor_post_ttc=2.0s_moco-adv_sub · coronal · 0.9mm · 0.85mm/px · 9 of 91 slices shown]
[im 1/91]
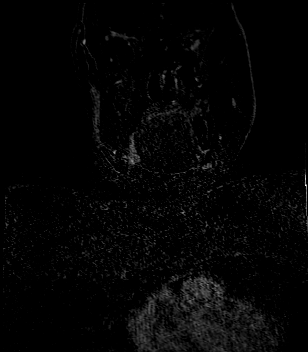
[im 12/91]
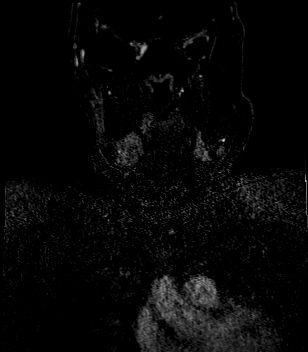
[im 23/91]
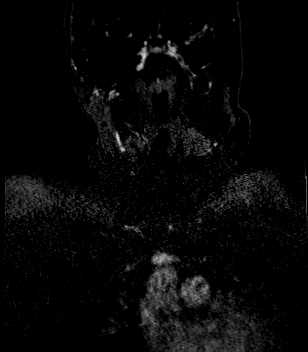
[im 34/91]
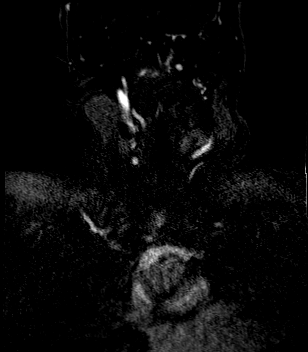
[im 46/91]
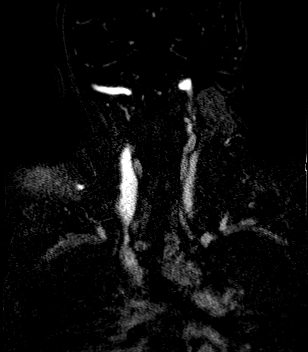
[im 57/91]
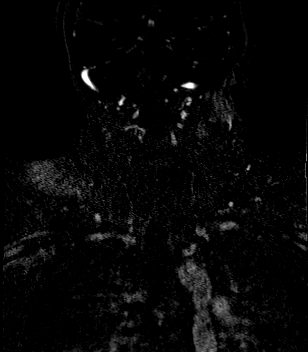
[im 68/91]
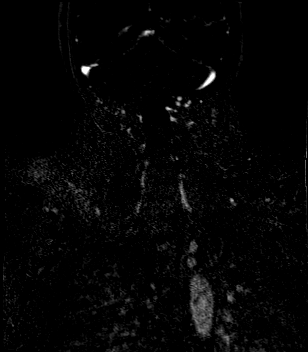
[im 79/91]
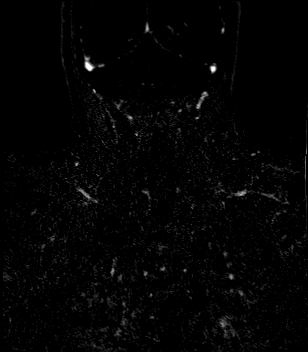
[im 91/91]
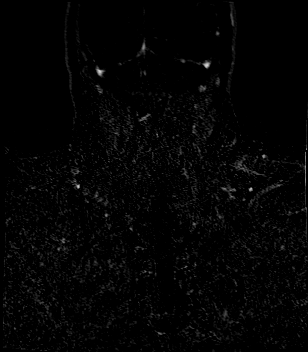

[9 of 48 positions shown; findings below may reference images not displayed]

FINDINGS: MR HEAD FINDINGS

Brain: No acute infarction, hemorrhage, hydrocephalus, extra-axial
collection or mass lesion. No white matter disease or atrophy

Vascular: See below.  Unremarkable flow voids

Skull and upper cervical spine: Normal marrow signal

Sinuses/Orbits: Gaze to the left. No visible orbit or sinus
inflammation. Clear mastoid and middle ear spaces.

Other: Area of hypointense susceptibility artifact along the left
temporal skin from calcification or foreign body.

MRA HEAD FINDINGS

Anterior circulation: Vessels are smooth and widely patent. No
branch occlusion, beading, or aneurysm

Posterior circulation: Vessels are smooth and widely patent. No
branch occlusion, beading, or aneurysm.

Generalized mild motion artifact.

MRA NECK FINDINGS

Aortic arch: Unremarkable.  Three vessel branching.

Right carotid system: No stenosis, beading, or aneurysm seen.
Carotid vessels are widely patent.

Left carotid system: Vessels are smoothly contoured and widely
patent.

Vertebral arteries: No proximal subclavian stenosis. The vertebral
arteries are smoothly contoured and widely patent on source images.

Other: Postcontrast MRA is degraded by venous contamination
IMPRESSION: Brain MRI:

Normal.

Intracranial and neck MRA:

Widely patent vessels.  No explanation for symptoms.

## 2021-04-05 MED ORDER — MECLIZINE HCL 25 MG PO TABS: 25.0000 mg | ORAL_TABLET | Freq: Four times a day (QID) | ORAL | 0 refills | Status: AC | PRN

## 2021-04-05 MED ORDER — GADOBUTROL 1 MMOL/ML IV SOLN
10.0000 mL | Freq: Once | INTRAVENOUS | Status: AC | PRN
  Administered 2021-04-05: 10 mL via INTRAVENOUS

## 2021-04-05 MED ORDER — LACTATED RINGERS IV BOLUS
1000.0000 mL | Freq: Once | INTRAVENOUS | Status: AC
  Administered 2021-04-05: 1000 mL via INTRAVENOUS

## 2021-04-05 MED ORDER — DROPERIDOL 2.5 MG/ML IJ SOLN
2.5000 mg | Freq: Once | INTRAMUSCULAR | Status: AC
  Administered 2021-04-05: 2.5 mg via INTRAVENOUS
  Filled 2021-04-05: qty 2

## 2021-04-05 NOTE — Discharge Instructions (Signed)
We believe your symptoms were caused by benign vertigo.  Please read through the included information and take any prescribed medication(s).  Follow up with your doctor as listed above.  If you develop any new or worsening symptoms that concern you, including but not limited to persistent dizziness/vertigo, numbness or weakness in your arms or legs, altered mental status, persistent vomiting, or fever greater than 101, please return immediately to the Emergency Department.  

## 2021-04-05 NOTE — ED Provider Notes (Signed)
[facility] Provider Note    Event Date/Time   First MD Initiated Contact with Patient 04/05/21 0117     (approximate)   History   Dizziness and Emesis   HPI  Leah Dodson is a 46 y.o. female who reports no contributory chronic medical issues and presents per private vehicle for evaluation of acute onset and severe dizziness associate with nausea vomiting.  She reports that she was sweeping the floor when it hit her all of a sudden.  She felt like the room was spinning and also felt like her body was spinning.  She almost fell down.  Even after sitting down or trying to lie down, anytime she opens her eyes or moves a little bit she vomits and has severe dizziness and with a spinning sensation.  Nothing in particular makes it better or worse and it is severe.  She has never had similar symptoms in the past.  She has no numbness nor tingling in her extremities.  If she remains still and keeps her eyes closed she feels better.     Physical Exam   Triage Vital Signs: ED Triage Vitals  Enc Vitals Group     BP 04/04/21 2249 (!) 140/108     Pulse Rate 04/04/21 2249 83     Resp 04/04/21 2249 18     Temp 04/04/21 2249 98.2 F (36.8 C)     Temp Source 04/04/21 2249 Oral     SpO2 04/04/21 2249 99 %     Weight 04/04/21 2251 106.6 kg (235 lb)     Height --      Head Circumference --      Peak Flow --      Pain Score 04/04/21 2250 7     Pain Loc --      Pain Edu? --      Excl. in GC? --     Most recent vital signs: Vitals:   04/05/21 0300 04/05/21 0432  BP: (!) 141/84 138/81  Pulse: 77 62  Resp: 16 16  Temp:    SpO2: 98% 100%     General: Awake, appears uncomfortable and keeping her eyes closed unless asked to do otherwise. Eyes:  Pupils are equal and reactive.  Patient has very noticeable horizontal nystagmus and becomes immediately symptomatic again when I try to examine her. CV:  Good peripheral perfusion.  Normal capillary  refill. Resp:  Normal effort.  Abd:  No distention.  No tenderness to palpation. Other:  Patient has nystagmus and vertigo, but no weakness in her extremities.  Difficult to fully perform neuro exam given the symptomatic vertigo, but no obvious cranial nerve deficits nor peripheral deficits.   ED Results / Procedures / Treatments   Labs (all labs ordered are listed, but only abnormal results are displayed) Labs Reviewed  COMPREHENSIVE METABOLIC PANEL - Abnormal; Notable for the following components:      Result Value   Glucose, Bld 157 (*)    All other components within normal limits  CBC - Abnormal; Notable for the following components:   WBC 10.7 (*)    Hemoglobin 11.6 (*)    HCT 34.1 (*)    MCV 79.5 (*)    All other components within normal limits  URINALYSIS, ROUTINE W REFLEX MICROSCOPIC - Abnormal; Notable for the following components:   APPearance CLOUDY (*)    Hgb urine dipstick MODERATE (*)    Bacteria, UA RARE (*)    Squamous Epithelial / LPF >  50 (*)    All other components within normal limits  POC URINE PREG, ED - Normal  URINE CULTURE  LIPASE, BLOOD     EKG  I personally reviewed the patient's EKG from 02/01/2021 and verify that she has no evidence of QTC prolongation.   RADIOLOGY I personally reviewed the MRI and MRA head/neck, and I appreciate no acute abnormalities.  Radiologist also does not appreciate any abnormalities.    PROCEDURES:  Critical Care performed: No  .1-3 Lead EKG Interpretation Performed by: Loleta RoseForbach, Cornell Bourbon, MD Authorized by: Loleta RoseForbach, Wandell Scullion, MD     Interpretation: normal     ECG rate:  86   ECG rate assessment: normal     Rhythm: sinus rhythm     Ectopy: none     Conduction: normal     MEDICATIONS ORDERED IN ED: Medications  ondansetron (ZOFRAN-ODT) disintegrating tablet 4 mg (4 mg Oral Given 04/04/21 2255)  droperidol (INAPSINE) 2.5 MG/ML injection 2.5 mg (2.5 mg Intravenous Given 04/05/21 0251)  lactated ringers bolus 1,000  mL (0 mLs Intravenous Stopped 04/05/21 0415)  gadobutrol (GADAVIST) 1 MMOL/ML injection 10 mL (10 mLs Intravenous Contrast Given 04/05/21 0421)     IMPRESSION / MDM / ASSESSMENT AND PLAN / ED COURSE  I reviewed the triage vital signs and the nursing notes.                              Differential diagnosis includes, but is not limited to, peripheral vertigo, central vertigo (CVA), carotid dissection.  The patient has profound vertigo with horizontal nystagmus.  No focal neurological deficits.  Vital signs initially were notable for hypertension but her blood pressure has come down without intervention.  Though I suspect this is a peripheral issue, the risk of morbidity/mortality associated with a missed central cause of her symptoms is sufficient to warrant MRI/MRA investigation.  I talked with her about this and she is in agreement.  For symptomatic improvement I am treating her with droperidol 2.5 mg IV as well as a liter IV fluid bolus. The patient is on the cardiac monitor to evaluate for evidence of arrhythmia and/or significant heart rate changes.  Disposition will depend on the results of the imaging.    Clinical Course as of 04/05/21 0514  Mon Apr 05, 2021  0502 MR BRAIN WO CONTRAST I personally reviewed the patient's MRI brain and MRA head and neck.  I do not see any evidence of acute abnormality.  The radiologist also did not see any evidence of acute abnormality or explanation of her symptoms. [CF]  0510 The patient has been sleeping.  When I woke her up she says she feels much better.  She is now willing to open her eyes and look around.  No nystagmus.  She says she no longer feels dizzy.  I gave her the choice and she said she would rather go home.  I considered whether or not she should be admitted for her symptoms, but given that they seem to have mostly if not completely resolved, I think she is appropriate for discharge.  I am writing her prescription for meclizine and encouraged  close outpatient follow-up.  I gave my usual and customary return precautions. [CF]    Clinical Course User Index [CF] Loleta RoseForbach, Maecyn Panning, MD     FINAL CLINICAL IMPRESSION(S) / ED DIAGNOSES   Final diagnoses:  Vertigo     Rx / DC Orders   ED Discharge  Orders          Ordered    meclizine (ANTIVERT) 25 MG tablet  4 times daily PRN        04/05/21 0514             Note:  This document was prepared using Dragon voice recognition software and may include unintentional dictation errors.   Loleta Rose, MD 04/05/21 [phone]

## 2021-04-06 LAB — URINE CULTURE

## 2021-05-27 ENCOUNTER — Ambulatory Visit
Admission: RE | Admit: 2021-05-27 | Discharge: 2021-05-27 | Disposition: A | Discharge status: Home / Self Care | Payer: 59 | Source: Ambulatory Visit | Attending: Internal Medicine | Admitting: Internal Medicine

## 2021-05-27 ENCOUNTER — Other Ambulatory Visit: Payer: Self-pay

## 2021-05-27 ENCOUNTER — Other Ambulatory Visit: Payer: Self-pay | Admitting: Internal Medicine

## 2021-05-27 DIAGNOSIS — S99912A Unspecified injury of left ankle, initial encounter: Secondary | ICD-10-CM | POA: Diagnosis present

## 2021-05-27 DIAGNOSIS — S82892D Other fracture of left lower leg, subsequent encounter for closed fracture with routine healing: Secondary | ICD-10-CM | POA: Diagnosis not present

## 2021-05-27 DIAGNOSIS — X58XXXD Exposure to other specified factors, subsequent encounter: Secondary | ICD-10-CM | POA: Diagnosis not present

## 2021-05-27 IMAGING — CR DG ANKLE COMPLETE 3+V*L*
1 series · 3 of 3 positions shown · non-contrast
Comparison: None.

CLINICAL DATA: Left ankle injury in [REDACTED].

EXAM:
LEFT ANKLE COMPLETE - 3+ VIEW

[Series 1: dg ankle complete left · 0.14mm/px · 3 of 3 slices shown]
[im 1/3]
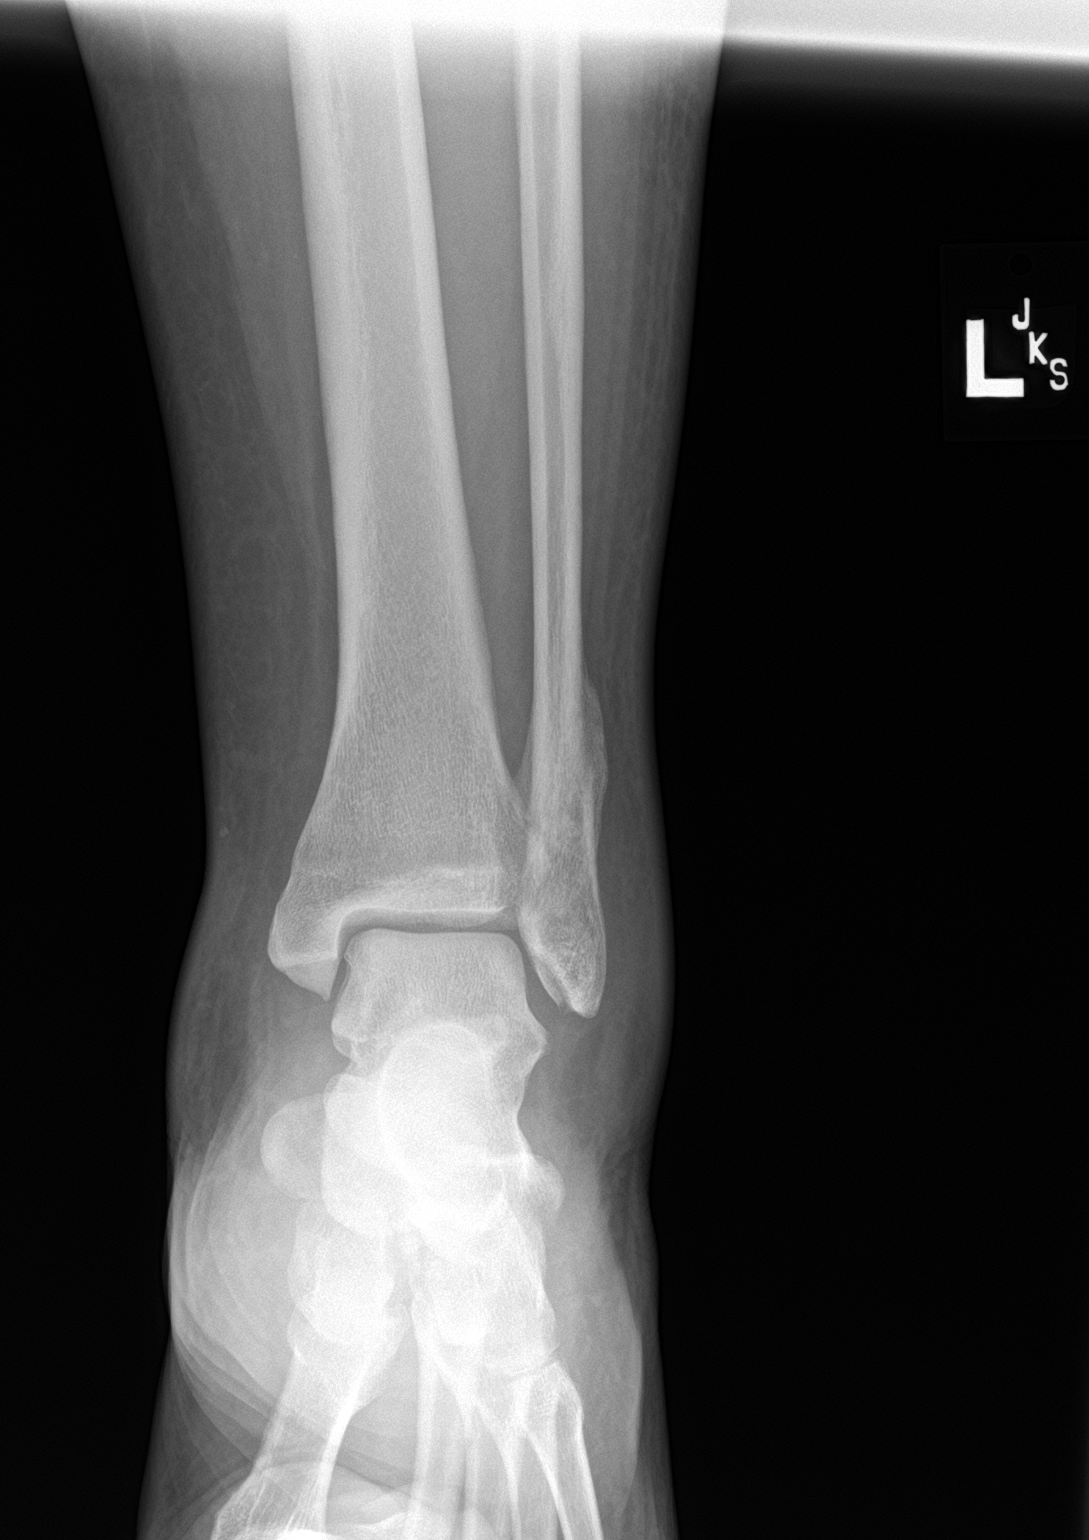
[im 2/3]
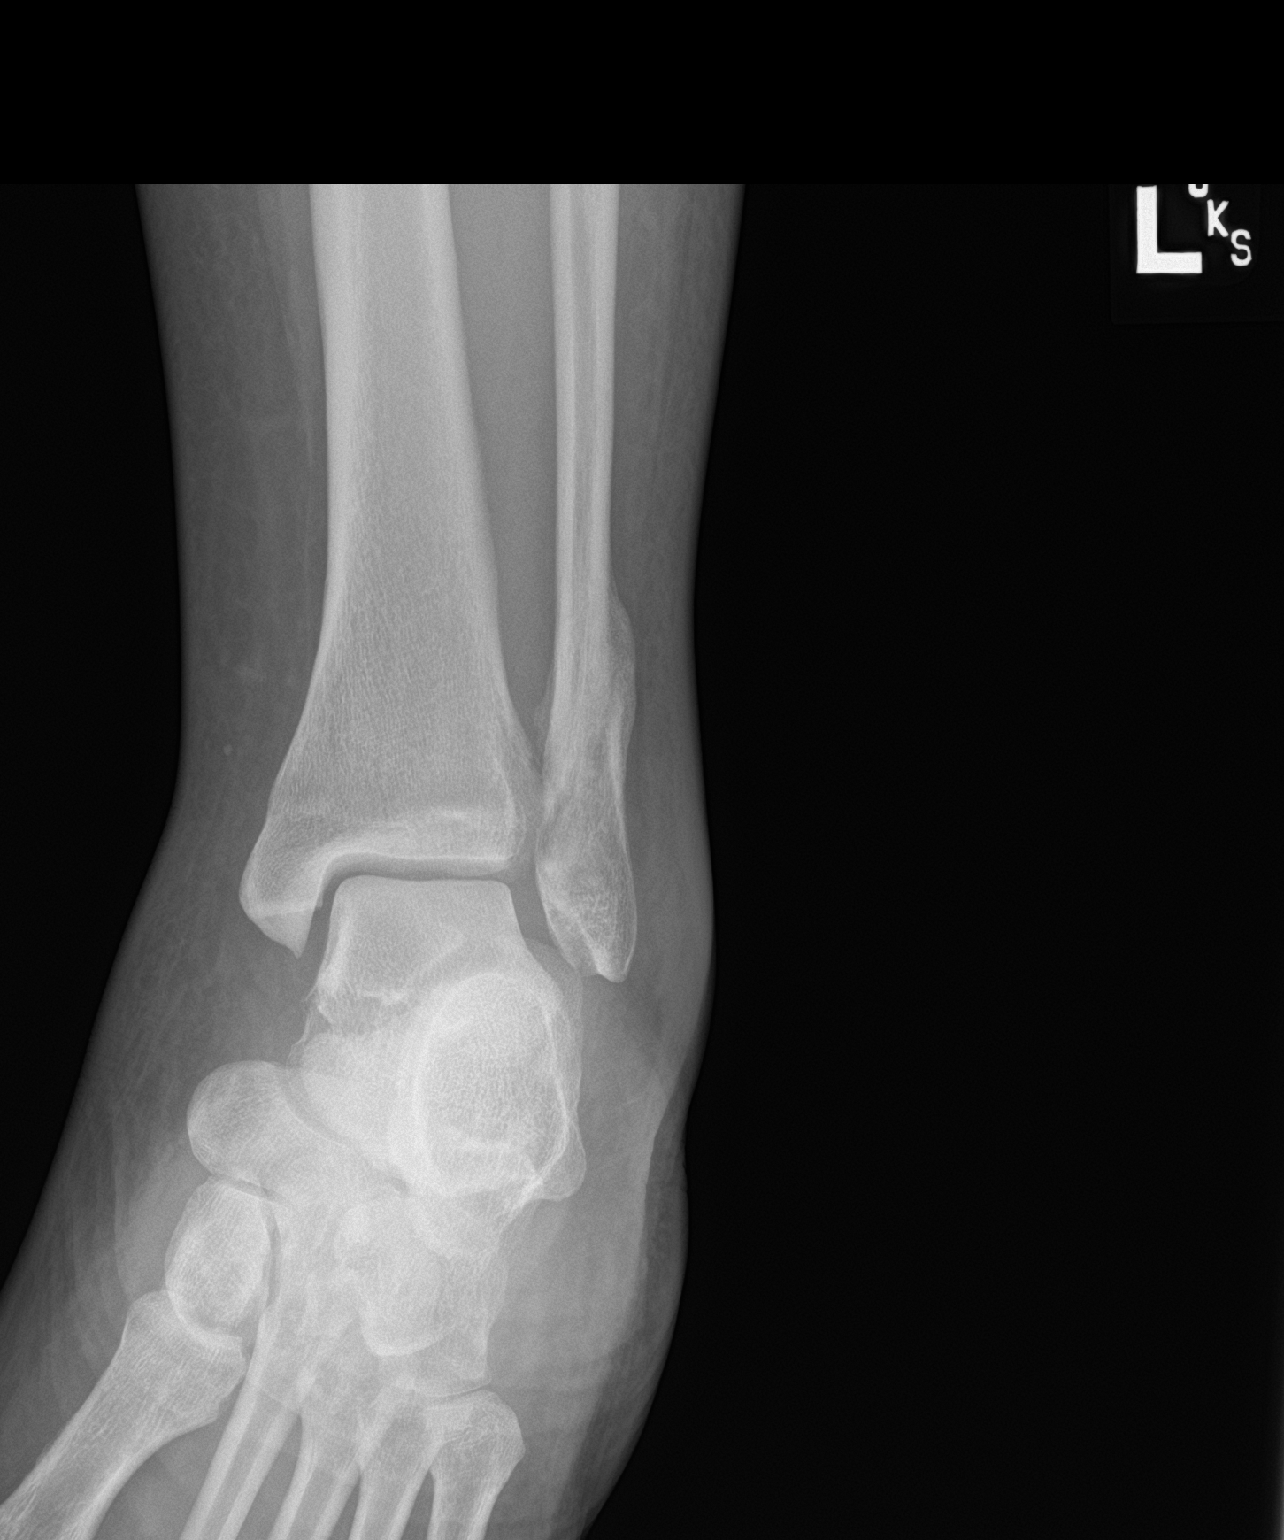
[im 3/3]
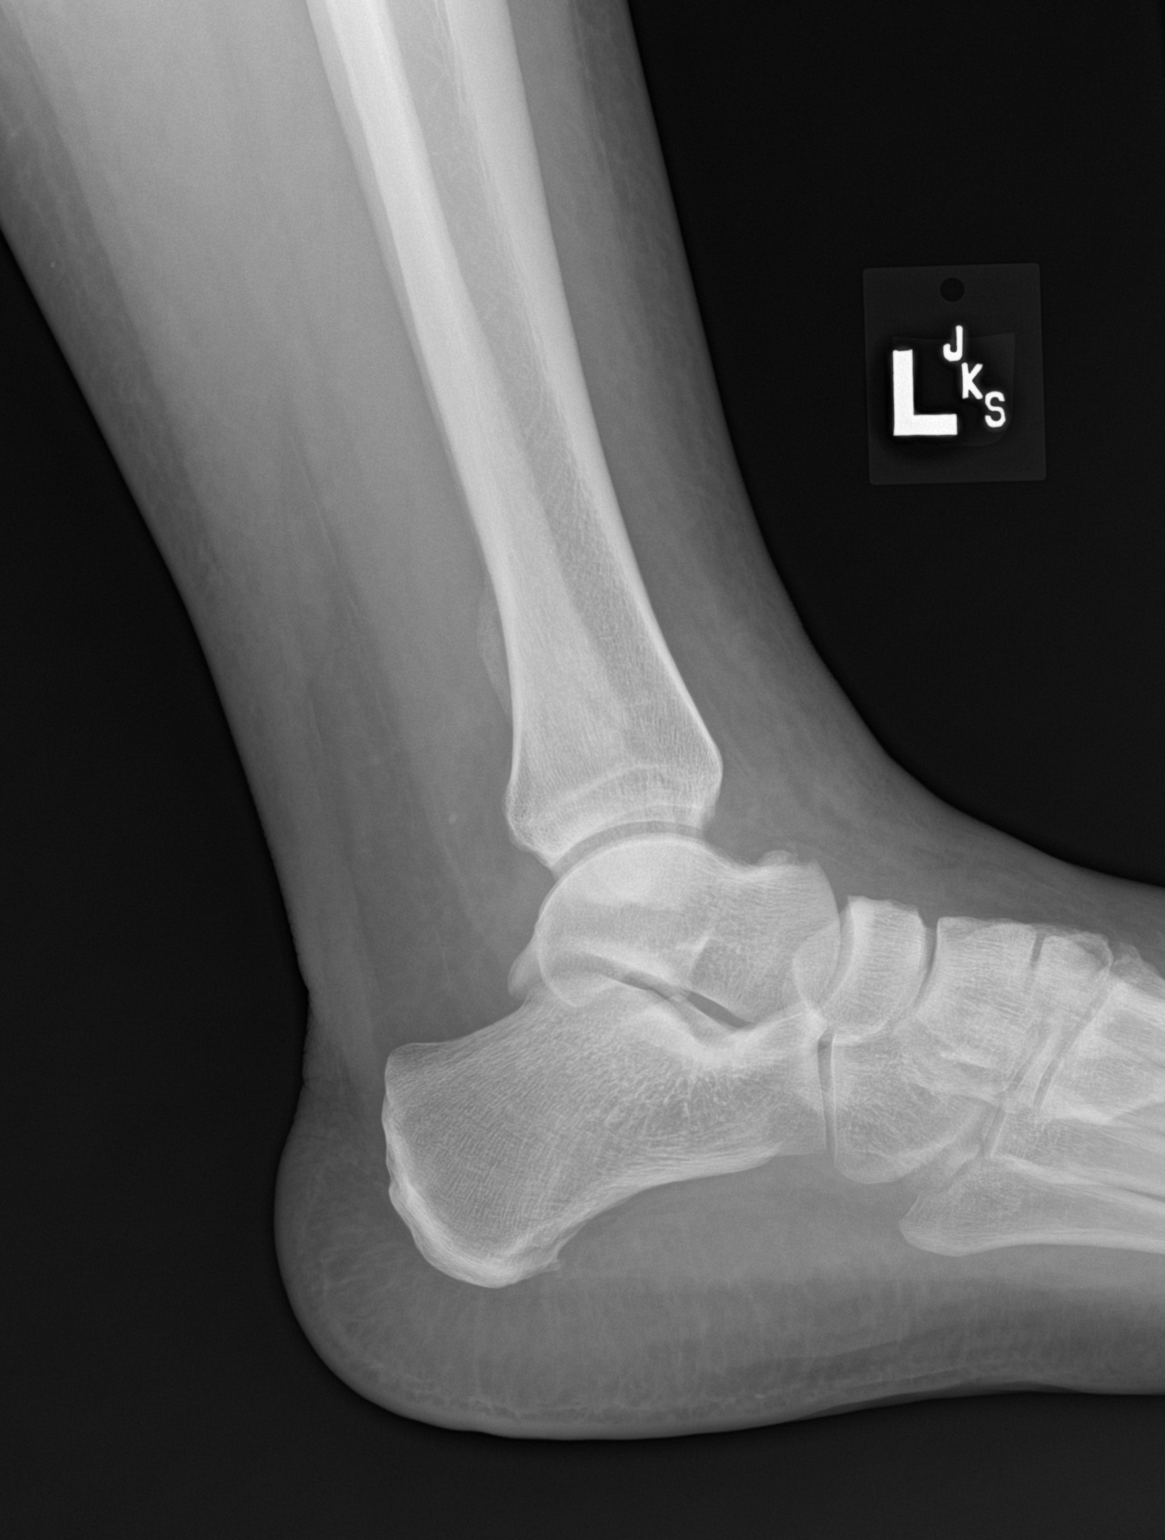

[3 of 3 positions shown; findings below may reference images not displayed]

FINDINGS: Healing distal fibular fracture with callus formation. Ankle mortise
is symmetric. Tiny plantar calcaneal spur.
IMPRESSION: Healing distal fibular fracture.

## 2021-06-10 ENCOUNTER — Ambulatory Visit: Payer: Self-pay | Admitting: Internal Medicine

## 2022-05-18 ENCOUNTER — Ambulatory Visit: Admission: RE | Admit: 2022-05-18 | Payer: 59 | Source: Home / Self Care | Admitting: Internal Medicine

## 2022-05-18 ENCOUNTER — Encounter: Admission: RE | Payer: Self-pay | Source: Home / Self Care

## 2022-05-18 SURGERY — COLONOSCOPY
Anesthesia: General

## 2022-08-30 ENCOUNTER — Encounter: Payer: Self-pay | Admitting: Internal Medicine

## 2022-08-31 ENCOUNTER — Ambulatory Visit
Admission: RE | Admit: 2022-08-31 | Discharge: 2022-08-31 | Disposition: A | Discharge status: Home / Self Care | Payer: 59 | Attending: Internal Medicine | Admitting: Internal Medicine

## 2022-08-31 ENCOUNTER — Encounter
Admission: RE | Disposition: A | Discharge status: Home / Self Care | Payer: Self-pay | Source: Home / Self Care | Attending: Internal Medicine

## 2022-08-31 ENCOUNTER — Ambulatory Visit: Payer: 59 | Admitting: Certified Registered"

## 2022-08-31 DIAGNOSIS — K581 Irritable bowel syndrome with constipation: Secondary | ICD-10-CM | POA: Diagnosis not present

## 2022-08-31 DIAGNOSIS — I10 Essential (primary) hypertension: Secondary | ICD-10-CM | POA: Insufficient documentation

## 2022-08-31 DIAGNOSIS — K5730 Diverticulosis of large intestine without perforation or abscess without bleeding: Secondary | ICD-10-CM | POA: Insufficient documentation

## 2022-08-31 DIAGNOSIS — K2970 Gastritis, unspecified, without bleeding: Secondary | ICD-10-CM | POA: Diagnosis not present

## 2022-08-31 DIAGNOSIS — K921 Melena: Secondary | ICD-10-CM | POA: Diagnosis not present

## 2022-08-31 DIAGNOSIS — G479 Sleep disorder, unspecified: Secondary | ICD-10-CM | POA: Insufficient documentation

## 2022-08-31 DIAGNOSIS — K219 Gastro-esophageal reflux disease without esophagitis: Secondary | ICD-10-CM | POA: Insufficient documentation

## 2022-08-31 DIAGNOSIS — R194 Change in bowel habit: Secondary | ICD-10-CM | POA: Insufficient documentation

## 2022-08-31 DIAGNOSIS — K449 Diaphragmatic hernia without obstruction or gangrene: Secondary | ICD-10-CM | POA: Diagnosis not present

## 2022-08-31 DIAGNOSIS — K640 First degree hemorrhoids: Secondary | ICD-10-CM | POA: Insufficient documentation

## 2022-08-31 DIAGNOSIS — Z6841 Body Mass Index (BMI) 40.0 and over, adult: Secondary | ICD-10-CM | POA: Insufficient documentation

## 2022-08-31 DIAGNOSIS — G4730 Sleep apnea, unspecified: Secondary | ICD-10-CM | POA: Diagnosis not present

## 2022-08-31 DIAGNOSIS — K319 Disease of stomach and duodenum, unspecified: Secondary | ICD-10-CM | POA: Diagnosis not present

## 2022-08-31 DIAGNOSIS — D123 Benign neoplasm of transverse colon: Secondary | ICD-10-CM | POA: Diagnosis not present

## 2022-08-31 HISTORY — PX: COLONOSCOPY: SHX5424

## 2022-08-31 HISTORY — PX: POLYPECTOMY: SHX5525

## 2022-08-31 HISTORY — PX: BIOPSY: SHX5522

## 2022-08-31 HISTORY — PX: ESOPHAGOGASTRODUODENOSCOPY (EGD) WITH PROPOFOL: SHX5813

## 2022-08-31 LAB — POCT PREGNANCY, URINE: Preg Test, Ur: NEGATIVE

## 2022-08-31 SURGERY — COLONOSCOPY
Anesthesia: General

## 2022-08-31 MED ORDER — PROPOFOL 10 MG/ML IV BOLUS
INTRAVENOUS | Status: DC | PRN
  Administered 2022-08-31: 100 mg via INTRAVENOUS
  Administered 2022-08-31: 20 mg via INTRAVENOUS

## 2022-08-31 MED ORDER — LIDOCAINE HCL (CARDIAC) PF 100 MG/5ML IV SOSY
PREFILLED_SYRINGE | INTRAVENOUS | Status: DC | PRN
  Administered 2022-08-31: 100 mg via INTRAVENOUS

## 2022-08-31 MED ORDER — SODIUM CHLORIDE 0.9 % IV SOLN
INTRAVENOUS | Status: DC
  Administered 2022-08-31: 20 mL/h via INTRAVENOUS

## 2022-08-31 MED ORDER — PROPOFOL 500 MG/50ML IV EMUL
INTRAVENOUS | Status: DC | PRN
  Administered 2022-08-31: 100 ug/kg/min via INTRAVENOUS

## 2022-08-31 NOTE — Transfer of Care (Signed)
Immediate Anesthesia Transfer of Care Note  Patient: Leah Dodson  Procedure(s) Performed: COLONOSCOPY ESOPHAGOGASTRODUODENOSCOPY (EGD) WITH PROPOFOL BIOPSY POLYPECTOMY  Patient Location: Endoscopy Unit  Anesthesia Type:General  Level of Consciousness: drowsy  Airway & Oxygen Therapy: Patient Spontanous Breathing and Patient connected to face mask oxygen  Post-op Assessment: Report given to RN, Post -op Vital signs reviewed and stable, and Patient moving all extremities  Post vital signs: Reviewed and stable  Last Vitals:  Vitals Value Taken Time  BP 119/73 08/31/22 1504  Temp 36.3 C 08/31/22 1503  Pulse 71 08/31/22 1505  Resp 28 08/31/22 1505  SpO2 100 % 08/31/22 1505  Vitals shown include unvalidated device data.  Last Pain:  Vitals:   08/31/22 1503  TempSrc: Temporal  PainSc: 0-No pain         Complications: No notable events documented.

## 2022-08-31 NOTE — Anesthesia Preprocedure Evaluation (Addendum)
Anesthesia Evaluation  Patient identified by MRN, date of birth, ID band Patient awake    Reviewed: Allergy & Precautions, H&P , NPO status , Patient's Chart, lab work & pertinent test results  Airway Mallampati: III  TM Distance: >3 FB Neck ROM: full    Dental no notable dental hx.    Pulmonary sleep apnea    Pulmonary exam normal        Cardiovascular hypertension, Normal cardiovascular exam     Neuro/Psych negative neurological ROS  negative psych ROS   GI/Hepatic Neg liver ROS, hiatal hernia,GERD  ,,  Endo/Other    Morbid obesity  Renal/GU negative Renal ROS  negative genitourinary   Musculoskeletal   Abdominal  (+) + obese  Peds  Hematology negative hematology ROS (+)   Anesthesia Other Findings Past Medical History: No date: Disturbance of sleep No date: GERD (gastroesophageal reflux disease) No date: History of hiatal hernia No date: Preeclampsia  Past Surgical History: No date: CHOLECYSTECTOMY 03/13/2015: COLONOSCOPY WITH PROPOFOL; N/A     Comment:  Procedure: COLONOSCOPY WITH PROPOFOL;  Surgeon: Wallace Cullens, MD;  Location: [facility] ENDOSCOPY;  Service:               Gastroenterology;  Laterality: N/A; No date: csections 03/13/2015: ESOPHAGOGASTRODUODENOSCOPY (EGD) WITH PROPOFOL; N/A     Comment:  Procedure: ESOPHAGOGASTRODUODENOSCOPY (EGD) WITH               PROPOFOL;  Surgeon: Wallace Cullens, MD;  Location: [facility]               ENDOSCOPY;  Service: Gastroenterology;  Laterality: N/A;  BMI    Body Mass Index: 46.23 kg/m      Reproductive/Obstetrics negative OB ROS                              Anesthesia Physical Anesthesia Plan  ASA: 3  Anesthesia Plan: General   Post-op Pain Management: Minimal or no pain anticipated   Induction: Intravenous  PONV Risk Score and Plan: Propofol infusion and TIVA  Airway Management Planned: Natural Airway  Additional  Equipment:   Intra-op Plan:   Post-operative Plan:   Informed Consent: I have reviewed the patients History and Physical, chart, labs and discussed the procedure including the risks, benefits and alternatives for the proposed anesthesia with the patient or authorized representative who has indicated his/her understanding and acceptance.     Dental Advisory Given  Plan Discussed with: CRNA and Surgeon  Anesthesia Plan Comments:         Anesthesia Quick Evaluation

## 2022-08-31 NOTE — Op Note (Signed)
[facility] Gastroenterology Patient Name: Leah Dodson Procedure Date: 08/31/2022 2:28 PM MRN: [id number] Account #: [id number] Date of Birth: 07-27-1975 Admit Type: Outpatient Age: 47 Room: [facility] ENDO ROOM 4 Gender: Female Note Status: Finalized Instrument Name: Prentice Docker [id number] Procedure:             Colonoscopy Indications:           Hematochezia, Irritable bowel syndrome with                         constipation, Change in bowel habits Providers:             Boykin Nearing. Norma Fredrickson MD, MD Referring MD:          Sherrie Mustache, MD (Referring MD) Medicines:             Propofol per Anesthesia Complications:         No immediate complications. Procedure:             Pre-Anesthesia Assessment:                        - The risks and benefits of the procedure and the                         sedation options and risks were discussed with the                         patient. All questions were answered and informed                         consent was obtained.                        - Patient identification and proposed procedure were                         verified prior to the procedure by the nurse. The                         procedure was verified in the procedure room.                        - ASA Grade Assessment: III - A patient with severe                         systemic disease.                        - After reviewing the risks and benefits, the patient                         was deemed in satisfactory condition to undergo the                         procedure.                        After obtaining informed consent, the colonoscope was                         passed under direct  vision. Throughout the procedure,                         the patient's blood pressure, pulse, and oxygen                         saturations were monitored continuously. The                         Colonoscope was introduced through the anus and                          advanced to the the cecum, identified by appendiceal                         orifice and ileocecal valve. The colonoscopy was                         performed without difficulty. The patient tolerated                         the procedure well. The quality of the bowel                         preparation was good. The ileocecal valve, appendiceal                         orifice, and rectum were photographed. Findings:      The perianal and digital rectal examinations were normal. Pertinent       negatives include normal sphincter tone and no palpable rectal lesions.      Non-bleeding internal hemorrhoids were found during retroflexion. The       hemorrhoids were Grade I (internal hemorrhoids that do not prolapse).      Many small-mouthed diverticula were found in the sigmoid colon,       transverse colon and ascending colon.      A 10 mm polyp was found in the transverse colon. The polyp was sessile.       The polyp was removed with a cold snare. Resection and retrieval were       complete.      The exam was otherwise without abnormality. Impression:            - Non-bleeding internal hemorrhoids.                        - Diverticulosis in the sigmoid colon, in the                         transverse colon and in the ascending colon.                        - One 10 mm polyp in the transverse colon, removed                         with a cold snare. Resected and retrieved.                        - The examination was otherwise normal. Recommendation:        -  Patient has a contact number available for                         emergencies. The signs and symptoms of potential                         delayed complications were discussed with the patient.                         Return to normal activities tomorrow. Written                         discharge instructions were provided to the patient.                        - Await pathology results from EGD, also performed                          today.                        - Resume previous diet.                        - Continue present medications.                        - Repeat colonoscopy is recommended for surveillance.                         The colonoscopy date will be determined after                         pathology results from today's exam become available                         for review.                        - Return to my office in 4 months.                        - Telephone GI office to schedule appointment.                        - The findings and recommendations were discussed with                         the patient. Procedure Code(s):     --- Professional ---                        [phone], Colonoscopy, flexible; with removal of                         tumor(s), polyp(s), or other lesion(s) by snare                         technique Diagnosis Code(s):     --- Professional ---  K57.30, Diverticulosis of large intestine without                         perforation or abscess without bleeding                        R19.4, Change in bowel habit                        K58.1, Irritable bowel syndrome with constipation                        K92.1, Melena (includes Hematochezia)                        D12.3, Benign neoplasm of transverse colon (hepatic                         flexure or splenic flexure)                        K64.0, First degree hemorrhoids CPT copyright 2022 American Medical Association. All rights reserved. The codes documented in this report are preliminary and upon coder review may  be revised to meet current compliance requirements. Stanton Kidney MD, MD 08/31/2022 3:04:03 PM This report has been signed electronically. Number of Addenda: 0 Note Initiated On: 08/31/2022 2:28 PM Scope Withdrawal Time: 0 hours 6 minutes 0 seconds  Total Procedure Duration: 0 hours 10 minutes 48 seconds  Estimated Blood Loss:  Estimated blood loss: none.      [facility]

## 2022-08-31 NOTE — Interval H&P Note (Signed)
History and Physical Interval Note:  08/31/2022 2:32 PM  Leah Dodson  has presented today for surgery, with the diagnosis of 530.81 (ICD-9-CM) - K21.9 (ICD-10-CM) - Gastroesophageal reflux disease, unspecified whether esophagitis present 578.1 (ICD-9-CM) - K92.1 (ICD-10-CM) - Hematochezia.  The various methods of treatment have been discussed with the patient and family. After consideration of risks, benefits and other options for treatment, the patient has consented to  Procedure(s): COLONOSCOPY (N/A) ESOPHAGOGASTRODUODENOSCOPY (EGD) WITH PROPOFOL (N/A) as a surgical intervention.  The patient's history has been reviewed, patient examined, no change in status, stable for surgery.  I have reviewed the patient's chart and labs.  Questions were answered to the patient's satisfaction.     [place], [place]

## 2022-08-31 NOTE — Op Note (Addendum)
[facility] Gastroenterology Patient Name: Leah Dodson Procedure Date: 08/31/2022 2:34 PM MRN: [id number] Account #: [id number] Date of Birth: [birth year] Admit Type: Outpatient Age: 47 Room: [facility] ENDO ROOM 4 Gender: Female Note Status: Supervisor Override Instrument Name: Patton Salles Endoscope [id number] Procedure:             Upper GI endoscopy Indications:           Follow-up of gastro-esophageal reflux disease Providers:             Boykin Nearing. Norma Fredrickson MD, MD Referring MD:          Sherrie Mustache, MD (Referring MD) Medicines:             Propofol per Anesthesia Complications:         No immediate complications. Procedure:             Pre-Anesthesia Assessment:                        - The risks and benefits of the procedure and the                         sedation options and risks were discussed with the                         patient. All questions were answered and informed                         consent was obtained.                        - Patient identification and proposed procedure were                         verified prior to the procedure by the nurse. The                         procedure was verified in the procedure room.                        - ASA Grade Assessment: III - A patient with severe                         systemic disease.                        - After reviewing the risks and benefits, the patient                         was deemed in satisfactory condition to undergo the                         procedure.                        After obtaining informed consent, the endoscope was                         passed under direct vision. Throughout the procedure,  the patient's blood pressure, pulse, and oxygen                         saturations were monitored continuously. The Endoscope                         was introduced through the mouth, and advanced to the                         third part of duodenum. The  upper GI endoscopy was                         accomplished without difficulty. The patient tolerated                         the procedure well. Findings:      The esophagus was normal.      Patchy minimal inflammation characterized by erosions was found in the       gastric antrum. Biopsies were taken with a cold forceps for Helicobacter       pylori testing.      The exam of the stomach was otherwise normal.      The cardia and gastric fundus were normal on retroflexion.      The examined duodenum was normal. Impression:            - Normal esophagus.                        - Gastritis. Biopsied.                        - Normal examined duodenum. Recommendation:        - Await pathology results.                        - Proceed with colonoscopy Procedure Code(s):     --- Professional ---                        [phone], Esophagogastroduodenoscopy, flexible,                         transoral; with biopsy, single or multiple Diagnosis Code(s):     --- Professional ---                        R10.13, Epigastric pain                        K29.70, Gastritis, unspecified, without bleeding CPT copyright 2022 American Medical Association. All rights reserved. The codes documented in this report are preliminary and upon coder review may  be revised to meet current compliance requirements. Stanton Kidney MD, MD 08/31/2022 2:45:30 PM This report has been signed electronically. Number of Addenda: 0 Note Initiated On: 08/31/2022 2:34 PM Estimated Blood Loss:  Estimated blood loss: none.      [facility]

## 2022-08-31 NOTE — H&P (Signed)
Outpatient short stay form Pre-procedure 08/31/2022 2:28 PM Leah Dodson K. Norma Fredrickson, M.D.  Primary Physician: Leah Neu, PA-C  Reason for visit:  GERD, epigastric pain, IBS w/ constipation, hematochezia.  History of present illness:  [name] is a pleasant 47 year old African-American female presenting for multiple problems. Patient has a long history of GERD with symptoms of burning in the chest throat and abdomen after meals. Foods such as tomatoes and certain breads can cause abdominal burning and discomfort. Patient denies any dysphagia or gross amount of belching, however, patient does have regurgitation at night and after meals. She is claimed to try omeprazole and over-the-counter antacids in the past with minimal to no response and improving the dyspepsia and burning. She has not tried H2 blockers. Patient takes omeprazole "sometimes" when her symptoms become severe but she does not take it every day. She has tried milk for abdominal burning with mild improvement affecting some soothing of the stomach but this is short-lived. Patient has a long history of constipation with symptoms of abdominal bloating, bowel and frequency, sensation of incomplete defecation. She also has lower abdominal cramping with production of mucus during bowel movements, altered stool caliber from solid to liquid. No scybalum stools as long as she takes her magnesium supplement. She still only has about 1 bowel movement every other day with a continued sensation of incomplete defecation. She takes no other laxatives at this time. She has taken Linzess in the past (dosage unknown) but this caused nausea so she stopped it. The Linzess, however, appeared to work at affecting 1 bowel movement daily with less abdominal pain. EGD and colonoscopy on 03/13/2015 by Dr. Bluford Dodson revealed mild gastritis (Biopsies taken but results not avail) and Normal Colonoscopy. Patient has a history of "thyroid disease" and was evaluated by Dr. Renard Dodson  several years ago at rheumatology. She is overdue for thyroid testing. She takes [employer] thyroid supplement. She takes Synthroid 75 mcg daily.     Current Facility-Administered Medications:    0.9 %  sodium chloride infusion, , Intravenous, Continuous, Collins, Boykin Nearing, MD, Last Rate: 20 mL/hr at 08/31/22 1251, 20 mL/hr at 08/31/22 1251  Medications Prior to Admission  Medication Sig Dispense Refill Last Dose   hydrochlorothiazide (HYDRODIURIL) 12.5 MG tablet Take 12.5 mg by mouth daily.   Past Month   Hyoscyamine Sulfate (HYOSCYAMINE PO) Take 1 tablet by mouth as needed.   Past Month   levothyroxine (SYNTHROID) 75 MCG tablet Take 75 mcg by mouth daily before breakfast.   Past Month   meclizine (ANTIVERT) 25 MG tablet Take 1 tablet (25 mg total) by mouth 4 (four) times daily as needed for dizziness or nausea. 30 tablet 0 Past Month   meloxicam (MOBIC) 15 MG tablet Take 1 tablet (15 mg total) by mouth daily. 30 tablet 0 Past Month   omeprazole (PRILOSEC) 40 MG capsule Take 40 mg by mouth daily.   Past Month   predniSONE (STERAPRED UNI-PAK 21 TAB) 10 MG (21) TBPK tablet Take 6 tablets the first day, take 5 tablets the second day, take 4 tablets the third day, take 3 tablets the fourth day, take 2 tablets the fifth day, take 1 tablet the sixth day. 21 tablet 0 Past Month     No Known Allergies   Past Medical History:  Diagnosis Date   Disturbance of sleep    GERD (gastroesophageal reflux disease)    History of hiatal hernia    Preeclampsia     Review of systems:  Otherwise negative.  Physical Exam  Gen: Alert, oriented. Appears stated age.  HEENT: [place]/AT. PERRLA. Lungs: CTA, no wheezes. CV: RR nl S1, S2. Abd: soft, benign, no masses. BS+ Ext: No edema. Pulses 2+    Planned procedures: Proceed with EGD and colonoscopy. The patient understands the nature of the planned procedure, indications, risks, alternatives and potential complications including but not limited to  bleeding, infection, perforation, damage to internal organs and possible oversedation/side effects from anesthesia. The patient agrees and gives consent to proceed.  Please refer to procedure notes for findings, recommendations and patient disposition/instructions.     Tara Rud K. Norma Fredrickson, M.D. Gastroenterology 08/31/2022  2:28 PM

## 2022-09-01 ENCOUNTER — Encounter: Payer: Self-pay | Admitting: Internal Medicine

## 2022-09-01 NOTE — Anesthesia Postprocedure Evaluation (Signed)
Anesthesia Post Note  Patient: Leah Dodson  Procedure(s) Performed: COLONOSCOPY ESOPHAGOGASTRODUODENOSCOPY (EGD) WITH PROPOFOL BIOPSY POLYPECTOMY  Patient location during evaluation: Endoscopy Anesthesia Type: General Level of consciousness: awake and alert Pain management: pain level controlled Vital Signs Assessment: post-procedure vital signs reviewed and stable Respiratory status: spontaneous breathing, nonlabored ventilation and respiratory function stable Cardiovascular status: blood pressure returned to baseline and stable Postop Assessment: no apparent nausea or vomiting Anesthetic complications: no   No notable events documented.   Last Vitals:  Vitals:   08/31/22 1503 08/31/22 1523  BP: 119/73 (!) 138/91  Pulse: 74   Resp: (!) 26   Temp: (!) 36.3 C   SpO2: 100%     Last Pain:  Vitals:   08/31/22 1523  TempSrc:   PainSc: 0-No pain                 Foye Deer
